# Patient Record
Sex: Male | Born: 1960 | Race: Black or African American | Hispanic: No | Marital: Single | Smoking: Current every day smoker
Health system: Southern US, Community
[De-identification: ages and names within clinical notes are randomized; demographics above are authoritative.]

## PROBLEM LIST (undated history)

## (undated) DIAGNOSIS — I10 Essential (primary) hypertension: Secondary | ICD-10-CM

## (undated) DIAGNOSIS — K219 Gastro-esophageal reflux disease without esophagitis: Secondary | ICD-10-CM

## (undated) DIAGNOSIS — M1990 Unspecified osteoarthritis, unspecified site: Secondary | ICD-10-CM

## (undated) HISTORY — PX: NECK SURGERY: SHX720

---

## 1998-09-11 ENCOUNTER — Ambulatory Visit (HOSPITAL_COMMUNITY): Admission: RE | Admit: 1998-09-11 | Discharge: 1998-09-11 | Payer: Self-pay | Admitting: Internal Medicine

## 1998-09-11 ENCOUNTER — Encounter: Payer: Self-pay | Admitting: Internal Medicine

## 1998-11-07 ENCOUNTER — Ambulatory Visit (HOSPITAL_COMMUNITY): Admission: RE | Admit: 1998-11-07 | Discharge: 1998-11-07 | Payer: Self-pay | Admitting: Orthopedic Surgery

## 1998-11-07 ENCOUNTER — Encounter: Payer: Self-pay | Admitting: Orthopedic Surgery

## 1999-05-27 ENCOUNTER — Encounter: Payer: Self-pay | Admitting: Neurosurgery

## 1999-05-27 ENCOUNTER — Ambulatory Visit (HOSPITAL_COMMUNITY): Admission: RE | Admit: 1999-05-27 | Discharge: 1999-05-27 | Payer: Self-pay | Admitting: Neurosurgery

## 1999-06-01 ENCOUNTER — Encounter: Payer: Self-pay | Admitting: Neurosurgery

## 1999-06-05 ENCOUNTER — Inpatient Hospital Stay (HOSPITAL_COMMUNITY): Admission: RE | Admit: 1999-06-05 | Discharge: 1999-06-07 | Payer: Self-pay | Admitting: Neurosurgery

## 1999-06-05 ENCOUNTER — Encounter: Payer: Self-pay | Admitting: Neurosurgery

## 1999-06-19 ENCOUNTER — Encounter: Admission: RE | Admit: 1999-06-19 | Discharge: 1999-06-19 | Payer: Self-pay | Admitting: Neurosurgery

## 1999-06-19 ENCOUNTER — Encounter: Payer: Self-pay | Admitting: Neurosurgery

## 1999-07-24 ENCOUNTER — Encounter: Admission: RE | Admit: 1999-07-24 | Discharge: 1999-07-24 | Payer: Self-pay | Admitting: Neurosurgery

## 1999-07-24 ENCOUNTER — Encounter: Payer: Self-pay | Admitting: Neurosurgery

## 1999-09-19 ENCOUNTER — Encounter: Payer: Self-pay | Admitting: Neurosurgery

## 1999-09-19 ENCOUNTER — Encounter: Admission: RE | Admit: 1999-09-19 | Discharge: 1999-09-19 | Payer: Self-pay | Admitting: Neurosurgery

## 2003-11-16 ENCOUNTER — Ambulatory Visit (HOSPITAL_COMMUNITY): Admission: RE | Admit: 2003-11-16 | Discharge: 2003-11-16 | Payer: Self-pay | Admitting: Internal Medicine

## 2003-11-16 IMAGING — CR DG LUMBAR SPINE COMPLETE 4+V
5 series · 5 of 5 positions shown · non-contrast
Comparison: none

CLINICAL DATA: Low back pain. 
 LUMBAR SPINE, COMPLETE
 Four view study with no priors.  
 There is degenerative disc disease at L4-5 and L5-S1 with prominent osteophytic spurring.  There is mild to moderate facet arthropathy.   No acute changes.  Soft tissues normal.  
 IMPRESSION 
 Degenerative disc disease at L4-5 and L5-S1.

[view not recorded (1 of 5)]
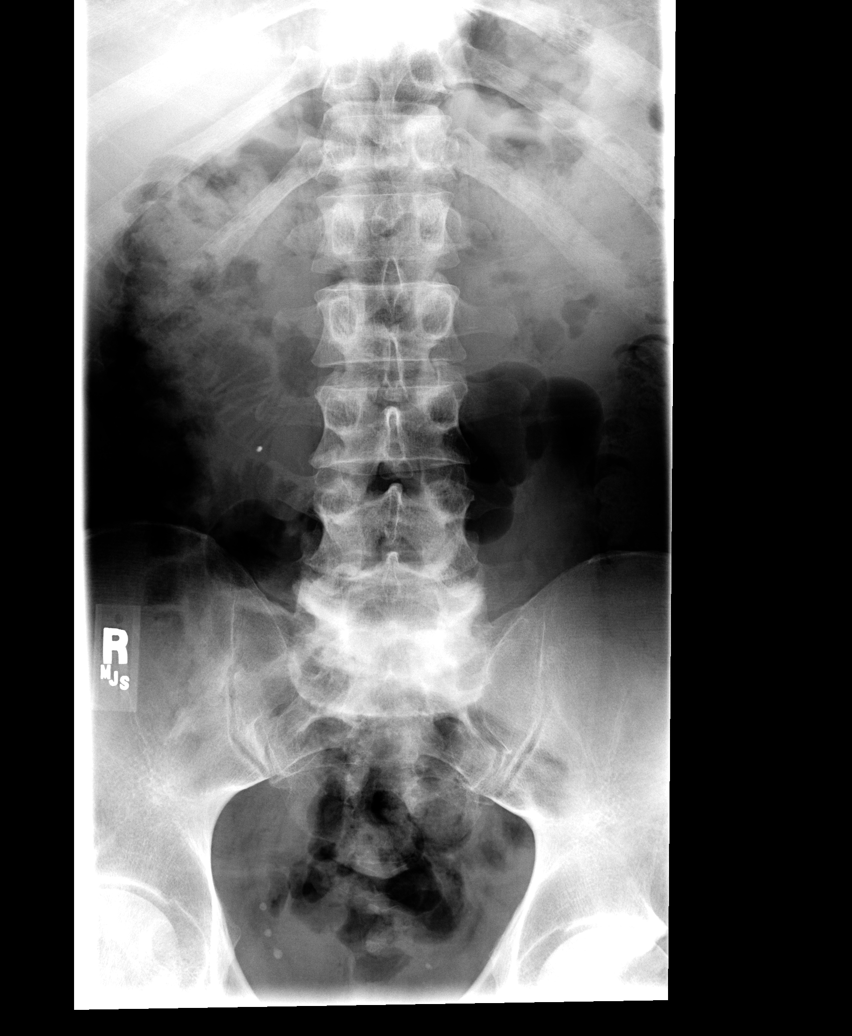

[view not recorded (2 of 5)]
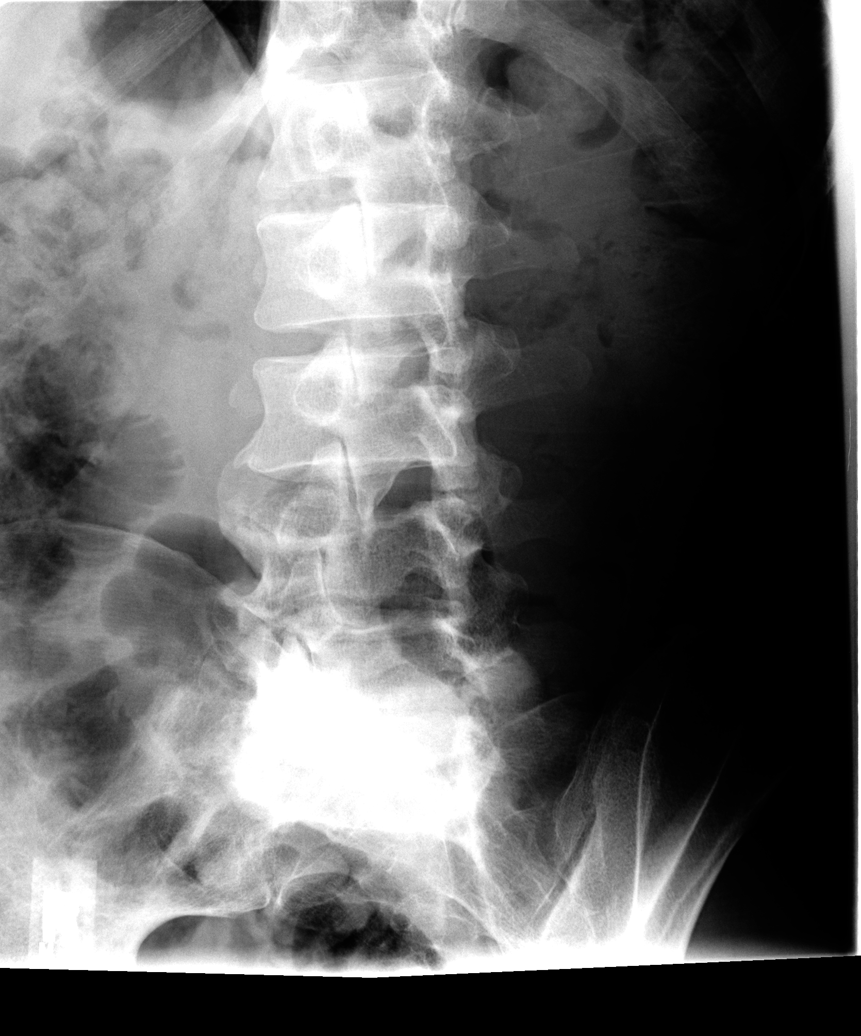

[view not recorded (3 of 5)]
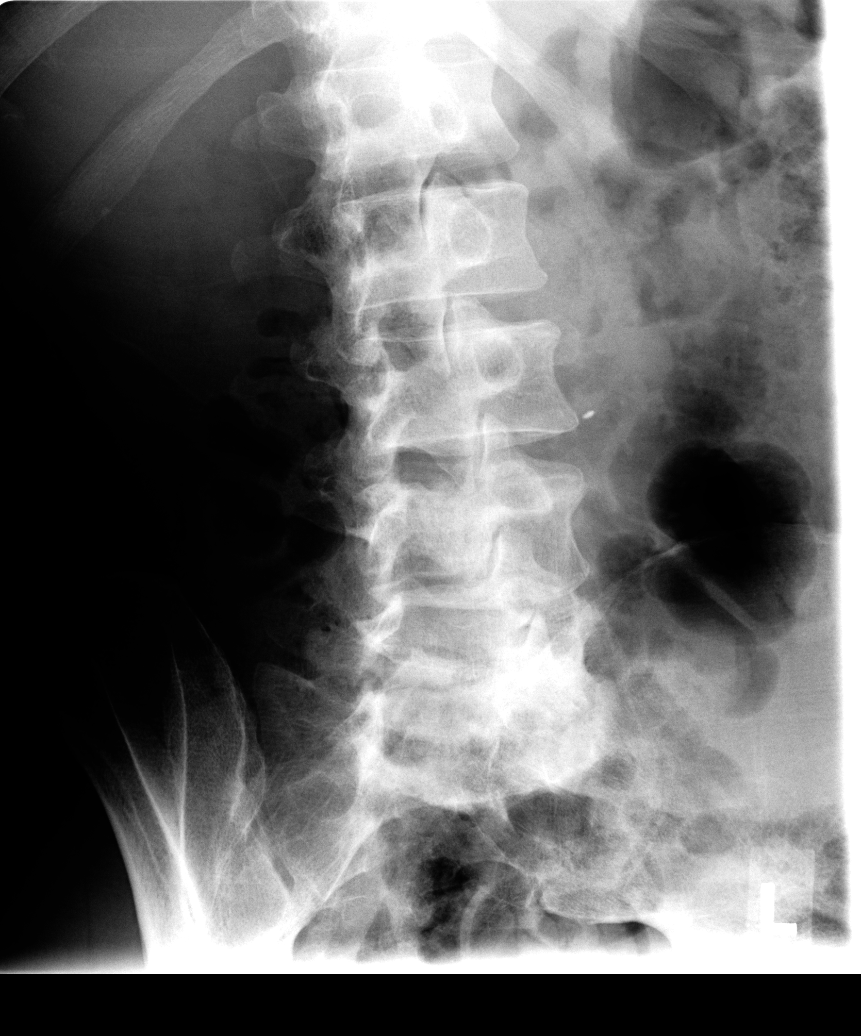

[view not recorded (4 of 5)]
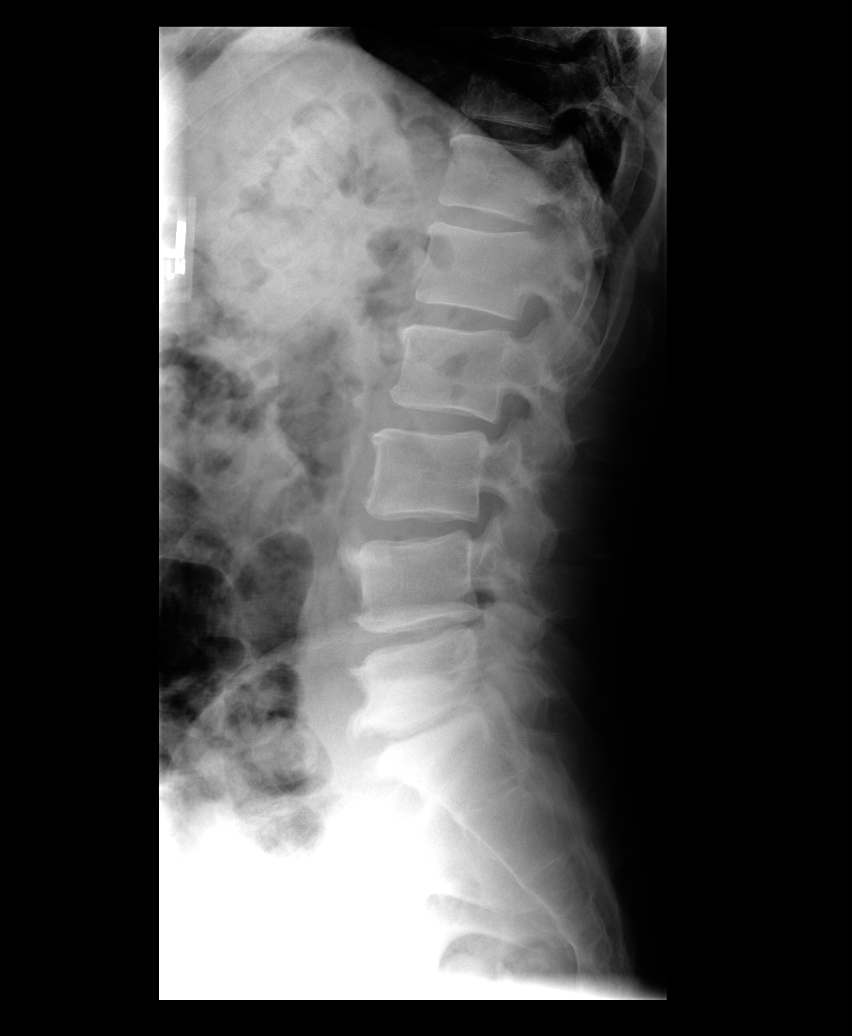

[view not recorded (5 of 5)]
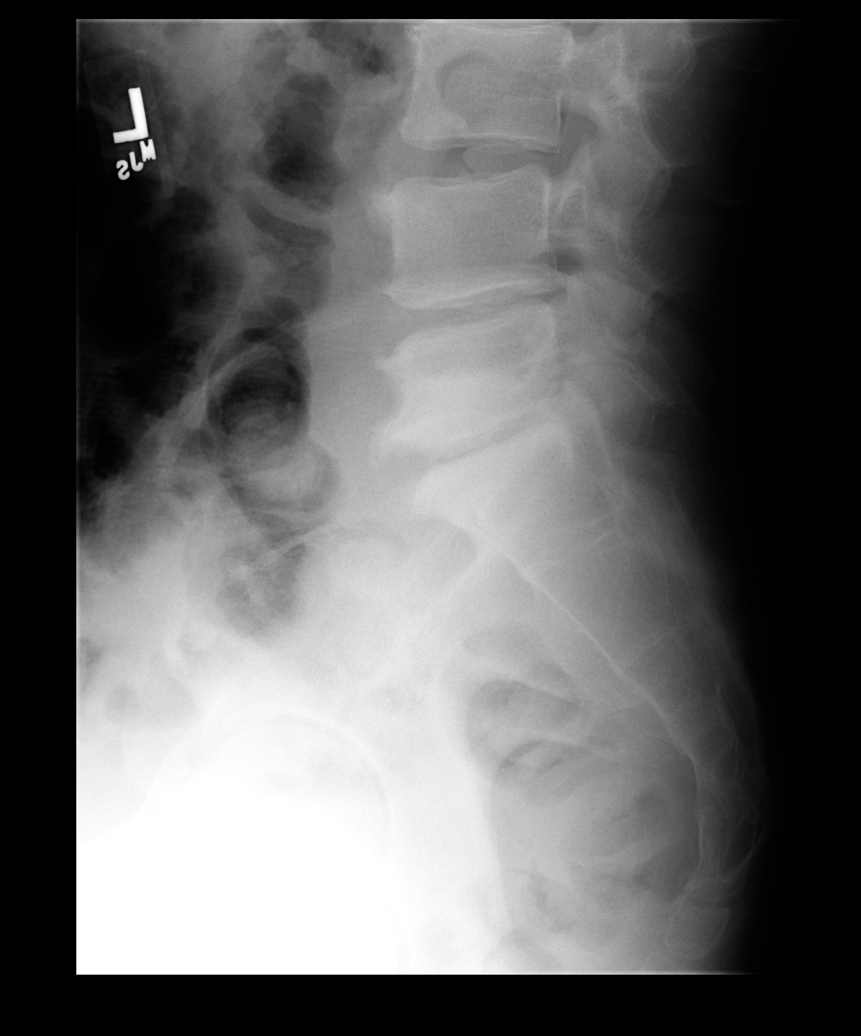

[5 of 5 positions shown; findings below may reference images not displayed]

## 2008-02-17 ENCOUNTER — Emergency Department (HOSPITAL_COMMUNITY): Admission: EM | Admit: 2008-02-17 | Discharge: 2008-02-17 | Payer: Self-pay | Admitting: Emergency Medicine

## 2008-02-17 IMAGING — CT CT PELVIS W/ CM
1 of 3 series · 14 of 32 positions shown, 19 images · IV contrast (APPLIED)
Comparison: None

CT ABDOMEN

CLINICAL DATA: Abdominal pain

CT ABDOMEN AND PELVIS WITH CONTRAST
TECHNIQUE: Multidetector CT imaging of the abdomen and pelvis was
performed using the standard protocol following bolus
administration of intravenous contrast.
Contrast: 100 ml omni 300

[Series 3: abd/pelv with 5.0 b31f st · axial · 0.68mm/px · z∈[-592,-202]mm · 14 of 88 slices shown, 19 images]
[im 5/88  soft-tissue]
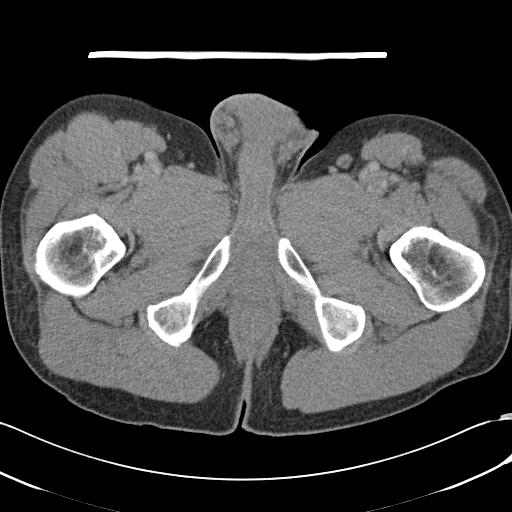
[im 5/88  bone]
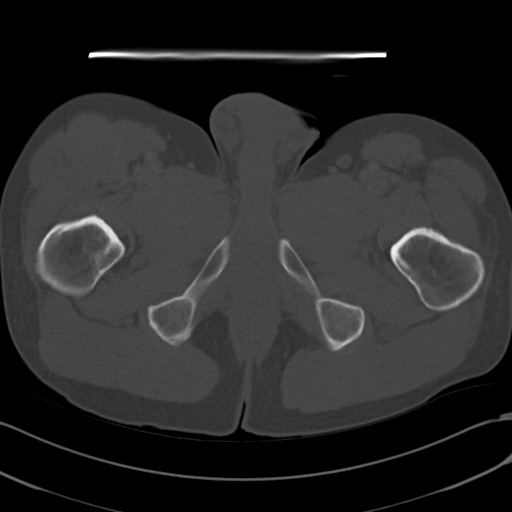
[im 14/88  soft-tissue]
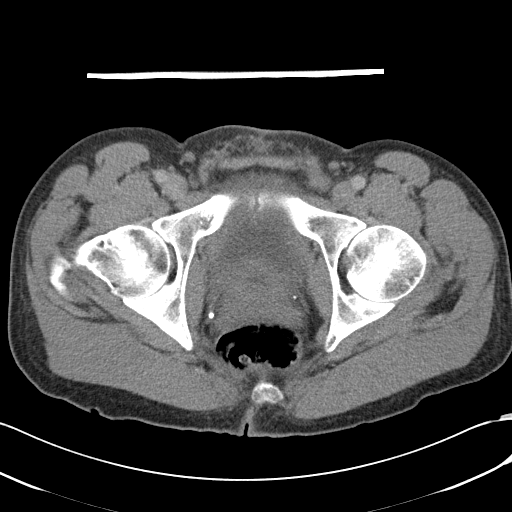
[im 18/88  soft-tissue]
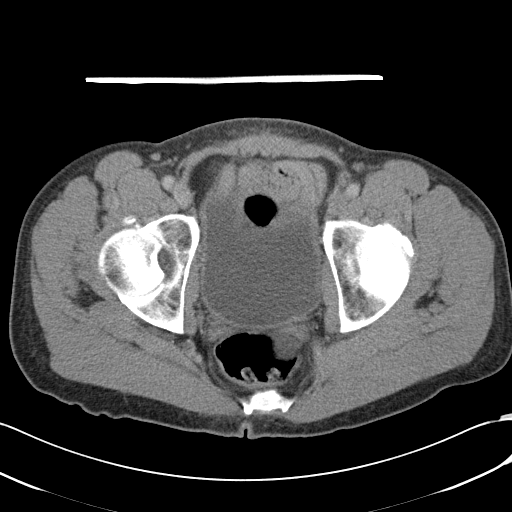
[im 27/88  soft-tissue]
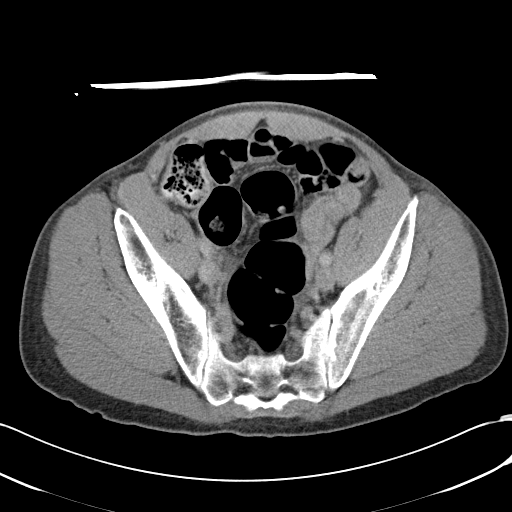
[im 31/88  soft-tissue]
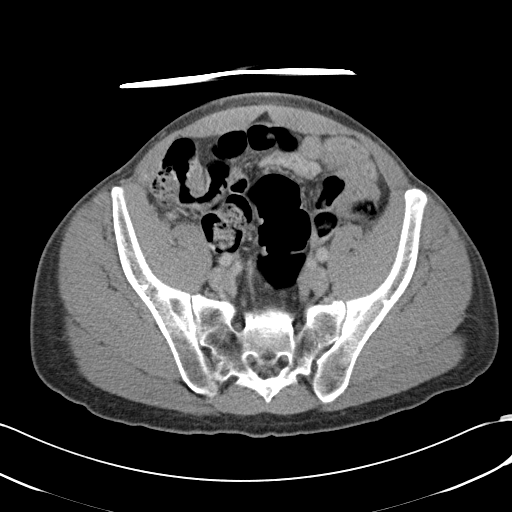
[im 40/88  soft-tissue]
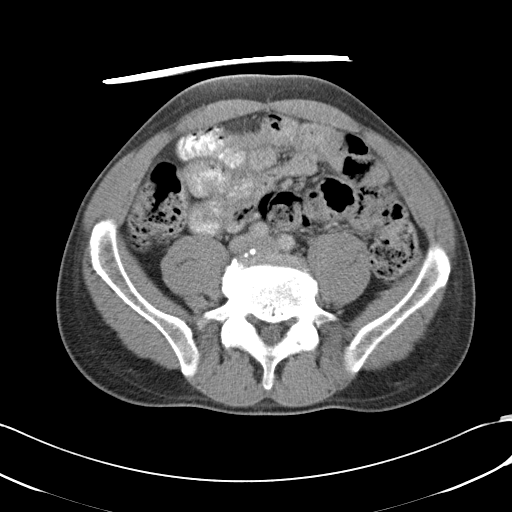
[im 44/88  soft-tissue]
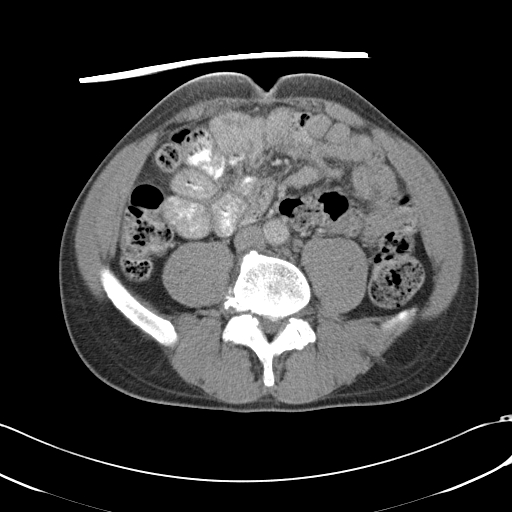
[im 48/88  soft-tissue]
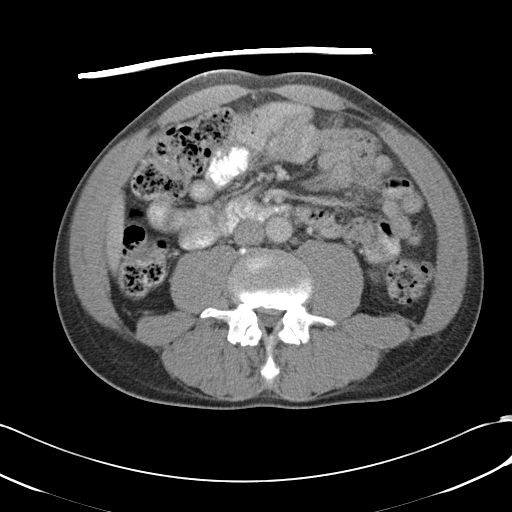
[im 57/88  soft-tissue]
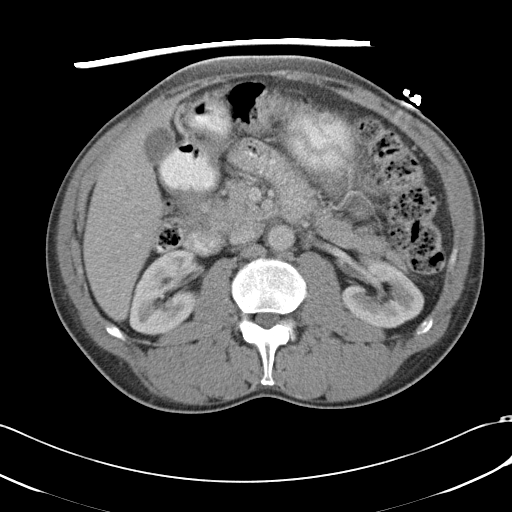
[im 57/88  bone]
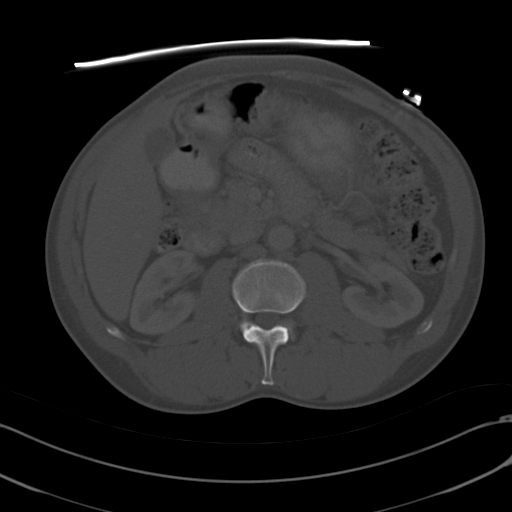
[im 61/88  soft-tissue]
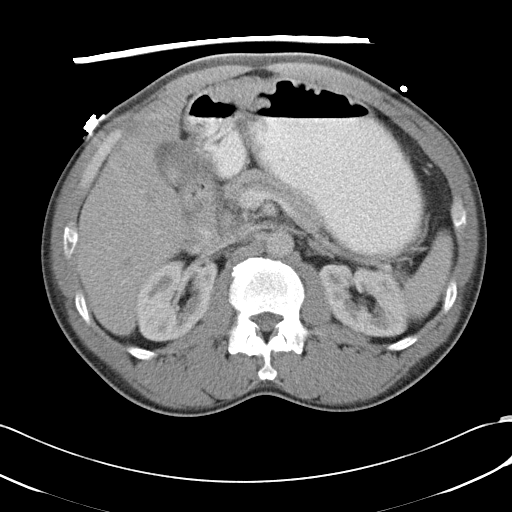
[im 70/88  soft-tissue]
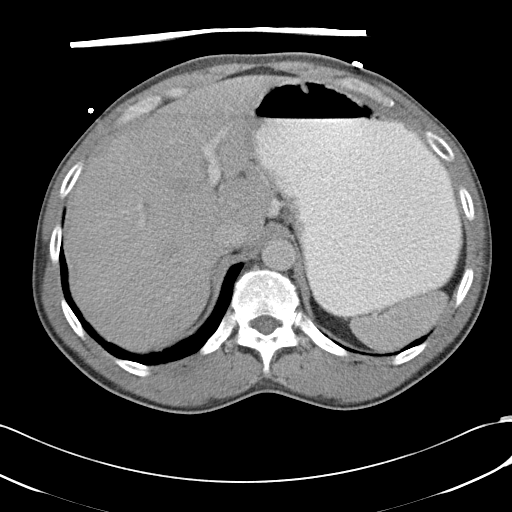
[im 70/88  lung]
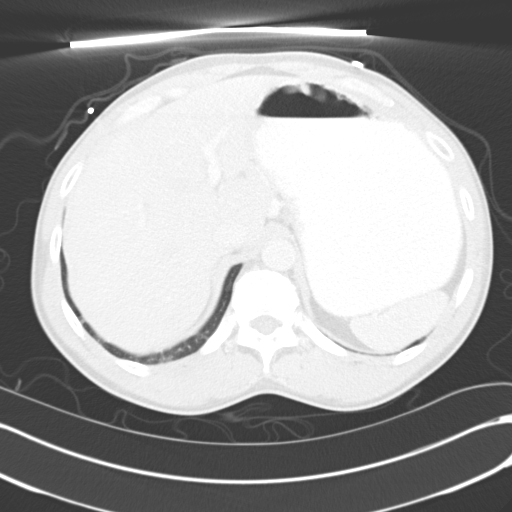
[im 74/88  soft-tissue]
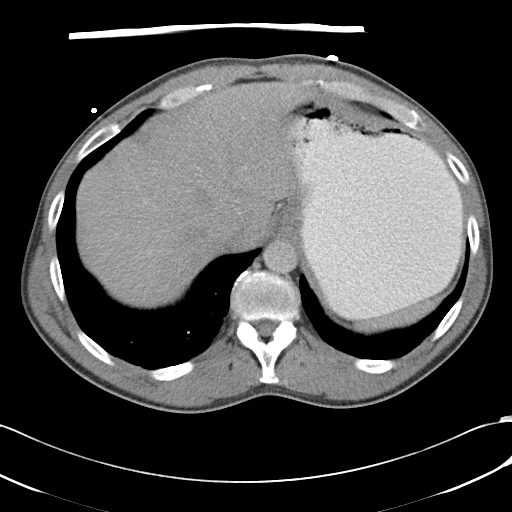
[im 74/88  lung]
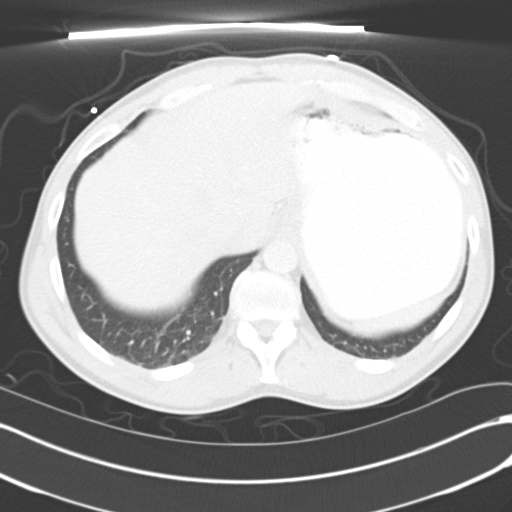
[im 79/88  lung]
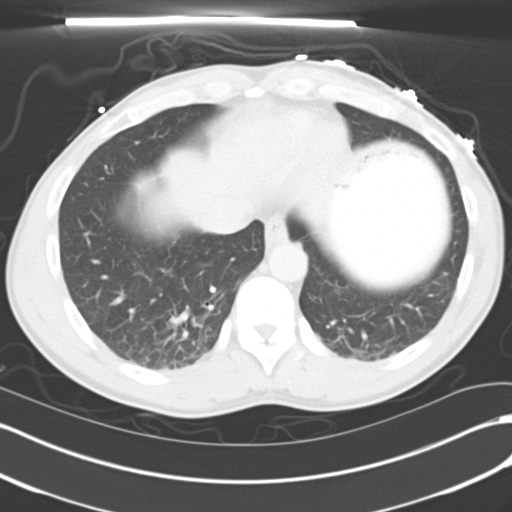
[im 83/88  soft-tissue]
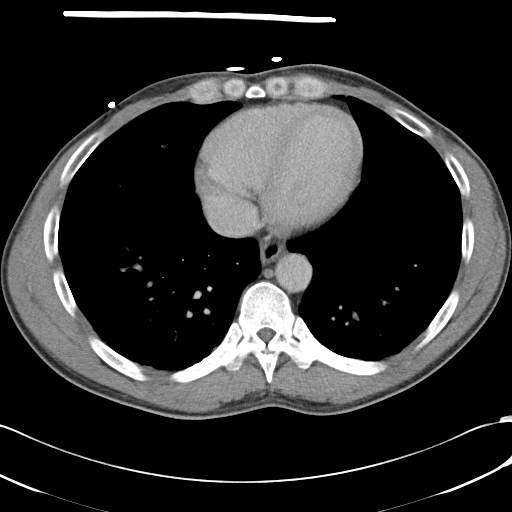
[im 83/88  lung]
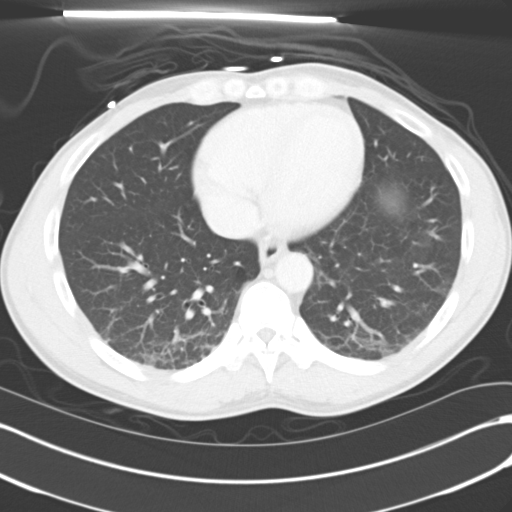

[14 of 32 positions shown; findings below may reference images not displayed]

FINDINGS: Coarsened interstitial markings are identified at the
lung bases which may reflect subsegmental atelectasis and/or
dependent change

Visualized lung bases are clear.

Liver is normal.

Spleen is normal.

The pancreas is normal.

The adrenal glands are normal.

 The kidneys are normal.

Gallbladder is unremarkable by CT.

No biliary ductal dilation.

Stomach and visualized large and small bowel are unremarkable.

Abdominal aorta normal is in caliber.

No significant lymphadenopathy.

No free fluid or abnormal fluid collections..

There is lumbar degenerative disc disease.
IMPRESSION: No acute upper abdominal CT findings.
2.  Lumbar degenerative disc disease.

CT PELVIS
FINDINGS: Appendix identified and normal.

Visualized colon and small bowel are unremarkable.

Trace free fluid is identified within the pelvis.

There is no abnormal fluid collections.

No significant lymphadenopathy.

Urinary bladder is normal.
IMPRESSION: 1.  Trace free fluid within the pelvis is nonspecific.
2.  Normal appendix

## 2008-05-02 ENCOUNTER — Emergency Department (HOSPITAL_COMMUNITY): Admission: EM | Admit: 2008-05-02 | Discharge: 2008-05-02 | Payer: Self-pay | Admitting: Emergency Medicine

## 2011-05-30 LAB — COMPREHENSIVE METABOLIC PANEL
ALT: 16
CO2: 25
Chloride: 107
Creatinine, Ser: 1.54 — ABNORMAL HIGH
GFR calc Af Amer: 59 — ABNORMAL LOW
Glucose, Bld: 86
Total Bilirubin: 1.3 — ABNORMAL HIGH

## 2011-05-30 LAB — DIFFERENTIAL
Eosinophils Absolute: 0.5
Lymphocytes Relative: 20
Monocytes Absolute: 0.4
Monocytes Relative: 5
Neutro Abs: 5.1

## 2011-05-30 LAB — URINALYSIS, ROUTINE W REFLEX MICROSCOPIC
Glucose, UA: NEGATIVE
Hgb urine dipstick: NEGATIVE
Ketones, ur: NEGATIVE
Nitrite: NEGATIVE
Protein, ur: NEGATIVE
Specific Gravity, Urine: 1.016

## 2011-05-30 LAB — LIPASE, BLOOD: Lipase: 18

## 2011-05-30 LAB — CBC
HCT: 39.9
Hemoglobin: 14.1
Platelets: 189
RDW: 13.4

## 2011-05-30 LAB — URINE MICROSCOPIC-ADD ON

## 2021-01-15 LAB — EXTERNAL GENERIC LAB PROCEDURE: COLOGUARD: NEGATIVE

## 2021-01-15 LAB — COLOGUARD: COLOGUARD: NEGATIVE

## 2021-02-16 ENCOUNTER — Other Ambulatory Visit: Payer: Self-pay | Admitting: Neurosurgery

## 2021-02-16 DIAGNOSIS — M5136 Other intervertebral disc degeneration, lumbar region: Secondary | ICD-10-CM

## 2021-02-16 DIAGNOSIS — M4712 Other spondylosis with myelopathy, cervical region: Secondary | ICD-10-CM

## 2021-03-06 ENCOUNTER — Other Ambulatory Visit: Payer: Self-pay

## 2021-03-08 ENCOUNTER — Other Ambulatory Visit: Payer: Self-pay

## 2021-03-19 ENCOUNTER — Ambulatory Visit
Admission: RE | Admit: 2021-03-19 | Discharge: 2021-03-19 | Disposition: A | Discharge status: Home / Self Care | Payer: Medicare HMO | Source: Ambulatory Visit | Attending: Neurosurgery | Admitting: Neurosurgery

## 2021-03-19 DIAGNOSIS — M4712 Other spondylosis with myelopathy, cervical region: Secondary | ICD-10-CM

## 2021-03-19 DIAGNOSIS — M5136 Other intervertebral disc degeneration, lumbar region: Secondary | ICD-10-CM

## 2021-03-19 DIAGNOSIS — M4722 Other spondylosis with radiculopathy, cervical region: Secondary | ICD-10-CM

## 2021-03-23 ENCOUNTER — Other Ambulatory Visit: Payer: Self-pay

## 2021-05-24 ENCOUNTER — Other Ambulatory Visit: Payer: Self-pay | Admitting: Neurosurgery

## 2021-05-24 DIAGNOSIS — M4712 Other spondylosis with myelopathy, cervical region: Secondary | ICD-10-CM

## 2021-05-24 DIAGNOSIS — M5136 Other intervertebral disc degeneration, lumbar region: Secondary | ICD-10-CM

## 2021-06-09 ENCOUNTER — Other Ambulatory Visit: Payer: Self-pay | Admitting: Neurosurgery

## 2021-06-09 DIAGNOSIS — M4712 Other spondylosis with myelopathy, cervical region: Secondary | ICD-10-CM

## 2021-06-22 ENCOUNTER — Ambulatory Visit
Admission: RE | Admit: 2021-06-22 | Discharge: 2021-06-22 | Disposition: A | Discharge status: Home / Self Care | Payer: Medicare HMO | Source: Ambulatory Visit | Attending: Neurosurgery | Admitting: Neurosurgery

## 2021-06-22 ENCOUNTER — Other Ambulatory Visit: Payer: Self-pay

## 2021-06-22 DIAGNOSIS — M4712 Other spondylosis with myelopathy, cervical region: Secondary | ICD-10-CM

## 2021-06-22 DIAGNOSIS — M5136 Other intervertebral disc degeneration, lumbar region: Secondary | ICD-10-CM

## 2021-06-22 IMAGING — MR MR CERVICAL SPINE W/O CM
4 of 5 series · 24 of 48 positions shown · non-contrast
Comparison: Prior MRIs could not be retrieved for comparison,
correlation is made with CT lumbar spine [DATE]

CLINICAL DATA: Left shoulder pain, bilateral leg pain and weakness

EXAM:
MRI CERVICAL, THORACIC AND LUMBAR SPINE WITHOUT CONTRAST
TECHNIQUE: Multiplanar and multiecho pulse sequences of the cervical spine, to
include the craniocervical junction and cervicothoracic junction,
and thoracic and lumbar spine, were obtained without intravenous
contrast.

[Series 2: T2 · sagittal · 3.0mm · 0.66mm/px · 6 of 19 slices shown (1 of 2)]
[im 1/19]
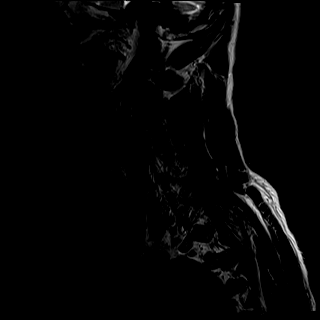
[im 4/19]
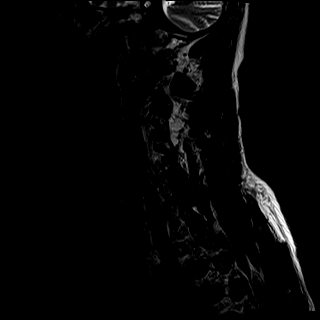
[im 8/19]
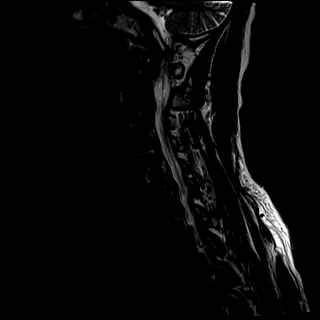
[im 11/19]
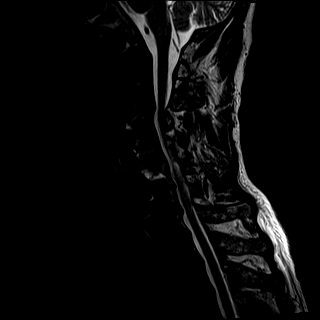
[im 15/19]
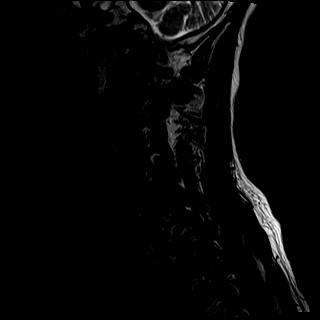
[im 19/19]
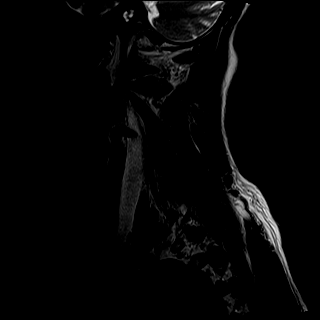

[Series 3: T1 · sagittal · 3.0mm · 0.41mm/px · 7 of 19 slices shown]
[im 1/19]
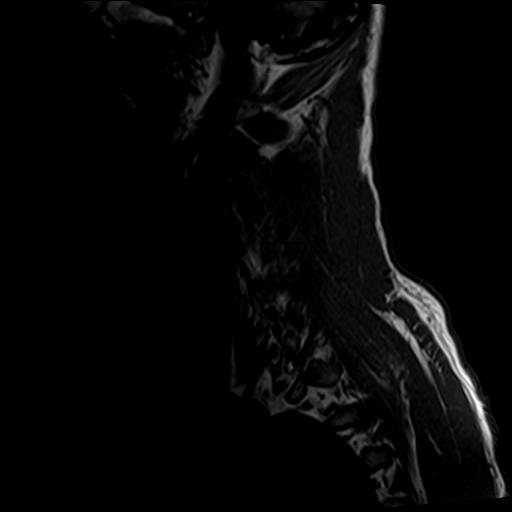
[im 4/19]
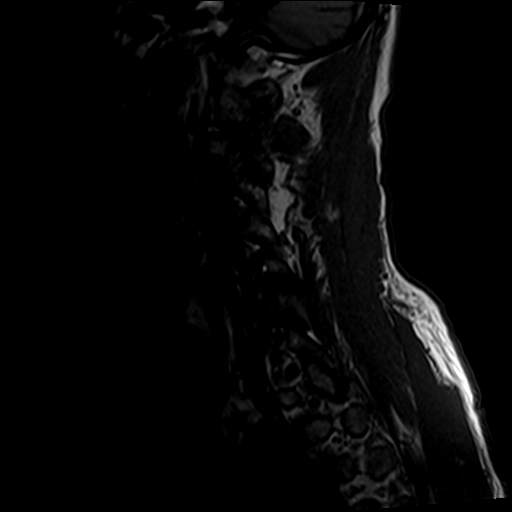
[im 7/19]
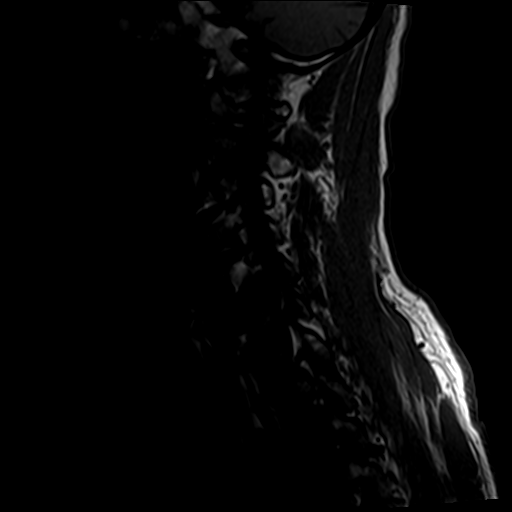
[im 10/19]
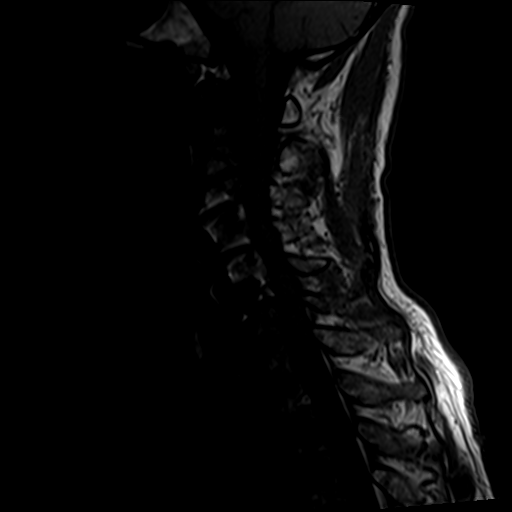
[im 13/19]
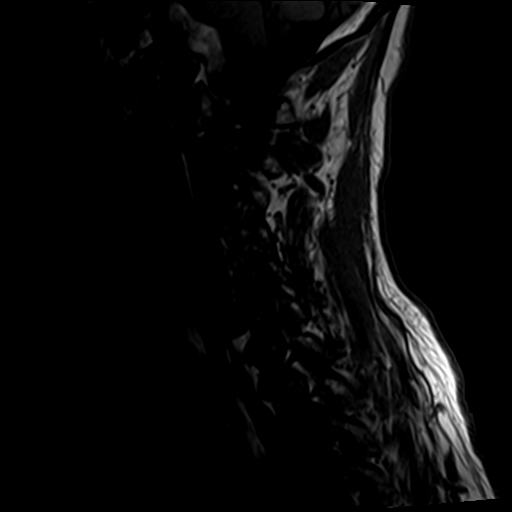
[im 16/19]
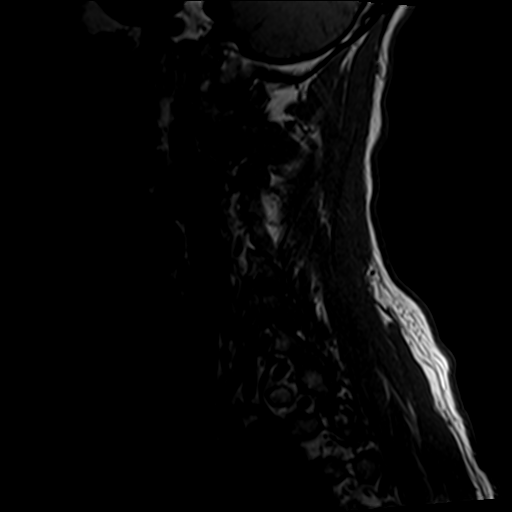
[im 19/19]
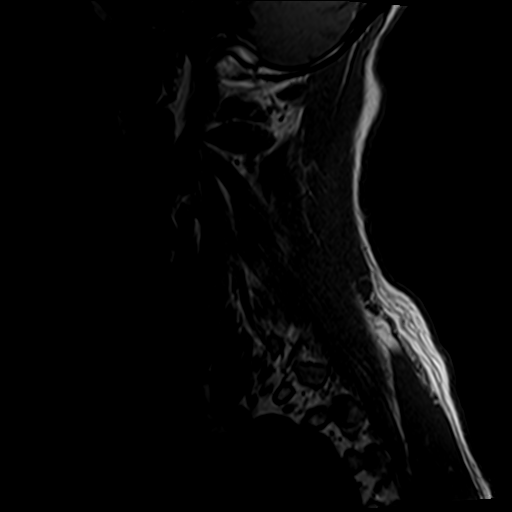

[Series 4: tir sag · sagittal · 3.0mm · 0.43mm/px · 3 of 19 slices shown]
[im 4/19]
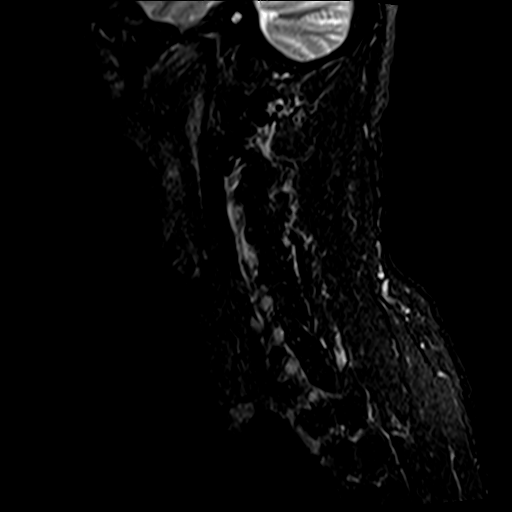
[im 10/19]
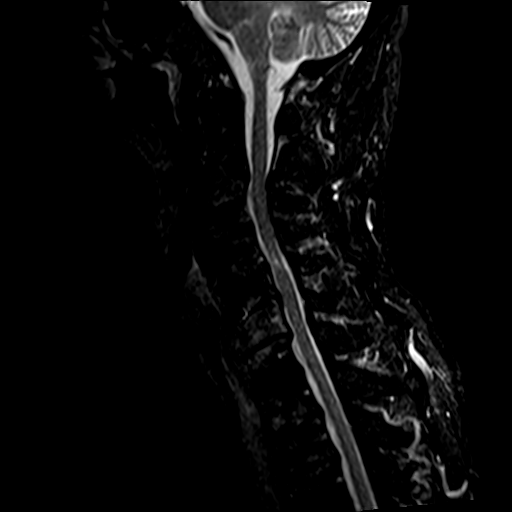
[im 16/19]
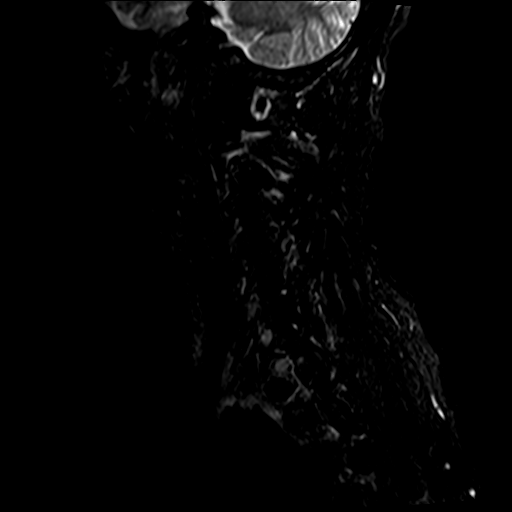

[Series 6: T2 · axial · 3.0mm · 0.78mm/px · z∈[-93,+48]mm · 8 of 40 slices shown (2 of 2)]
[im 1/40]
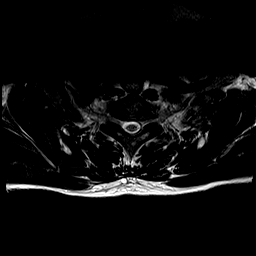
[im 7/40]
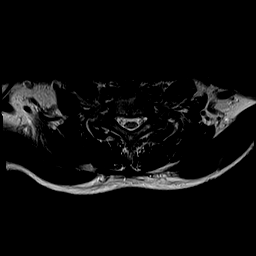
[im 13/40]
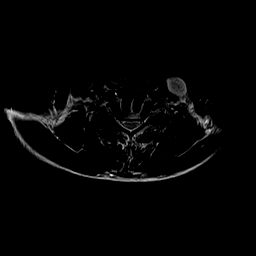
[im 19/40]
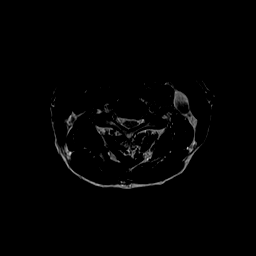
[im 22/40]
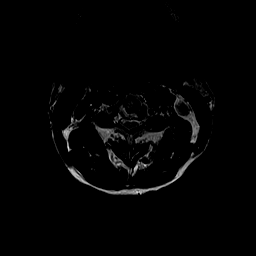
[im 28/40]
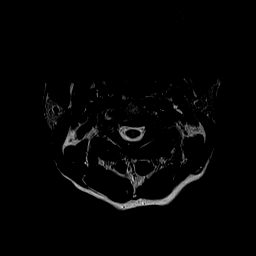
[im 34/40]
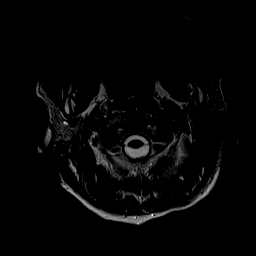
[im 40/40]
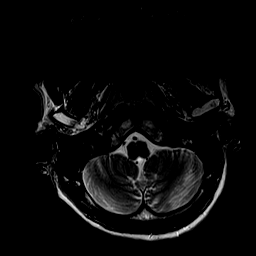

[24 of 48 positions shown; findings below may reference images not displayed]

FINDINGS: MRI CERVICAL SPINE FINDINGS

Alignment: Straightening of the normal cervical lordosis. No
listhesis.

Vertebrae: Status post ACDF C3-C5. Increased T2 signal at the C2-C3
endplates, the C5-C6 endplates, and the C6-C7 endplates, favored to
be degenerative. No acute fracture. Congenitally short pedicles,
which narrow the AP diameter of the spinal canal. Heterogeneous
marrow signal.

Cord: Focally increased T2 signal within the cervical spinal cord at
C4-C5 (series 2, image 9), with decreased cord caliber, likely
chronic myelomalacia. The spinal cord is otherwise normal in signal
and morphology.

Posterior Fossa, vertebral arteries, paraspinal tissues: Negative.

Disc levels:

C2-C3: Disc height loss with disc bulge, ligamentum flavum
thickening, facet and uncovertebral hypertrophy. Mild-to-moderate
spinal canal stenosis. Moderate right and mild left neural foraminal
narrowing.

C3-C4: Status post fusion. Mild-to-moderate spinal canal stenosis.
Uncovertebral and facet arthropathy. Mild bilateral neural foraminal
narrowing.

C4-C5: Status post fusion. Mild spinal canal stenosis. Uncovertebral
and facet arthropathy. Mild right and mild-to-moderate left neural
foraminal narrowing.

C5-C6: Disc height loss with disc osteophyte complex. Uncovertebral
and facet arthropathy. Mild spinal canal stenosis. Moderate to
severe bilateral neural foraminal narrowing.

C6-C7: Disc height loss with disc osteophyte complex. Mild spinal
canal stenosis. Facet arthropathy and uncovertebral hypertrophy.
Severe bilateral neural foraminal narrowing.

C7-T1: Minimal disc bulge. Facet arthropathy. Mild bilateral neural
foraminal narrowing.

MRI THORACIC SPINE FINDINGS

Alignment:  Physiologic.

Vertebrae: No acute fracture. Multiple T1 and T2 hyperintense
lesions, the largest of which is in T5, most likely hemangiomas.
Multilevel endplate degenerative changes, most prominent posteriorly
T2-T3 and anteriorly T11-T12. Heterogeneous marrow signal.

Cord: Focal mildly increased T2 signal at T11-T12. Otherwise normal
in caliber and signal.

Paraspinal and other soft tissues: Negative.

Disc levels:

T11-T12 disc height loss, disc bulge, facet arthropathy, and
ligamentum flavum hypertrophy, which leads to severe spinal canal
stenosis. Additional small disc bulges in the thoracic spine do not
cause significant spinal canal stenosis.

Multilevel facet arthropathy, which causes moderate neural foraminal
narrowing bilaterally at T1-T2, T2-T3, and T3-T4; moderate to severe
bilateral neural foraminal narrowing at T9-T10 and T10-T11; and
severe bilateral neural foraminal narrowing at T11-T12.

MRI LUMBAR SPINE FINDINGS

Segmentation:  Standard.

Alignment:  Straightening the normal lumbar lordosis.

Vertebrae: Multiple endplate degenerative changes, T2-S1 with mild
vertebral body loss. No focal fracture. No suspicious osseous
lesion. Heterogeneous marrow signal. Congenitally short pedicles,
which narrow the AP diameter of the spinal canal.

Conus medullaris and cauda equina: Conus extends to the L1 level.
Conus and cauda equina appear normal.

Paraspinal and other soft tissues: Negative.

Disc levels:

T12-L1: No significant disc bulge. Moderate to severe facet
arthropathy. Mild left neural foraminal narrowing. No spinal canal
stenosis

L1-L2: No significant disc bulge. Moderate facet arthropathy. No
spinal canal stenosis or neural foraminal narrowing.

L2-L3: Severe disc height loss with disc osteophyte complex and
moderate disc bulge. Moderate to severe facet arthropathy. Epidural
lipomatosis. Moderate to severe thecal sac narrowing. Moderate
bilateral neural foraminal narrowing.

L3-L4: Severe disc height loss with disc osteophyte complex and
moderate disc bulge with superimposed central protrusion. Moderate
to severe facet arthropathy. Epidural lipomatosis. Moderate to
severe thecal sac narrowing. Moderate to severe bilateral neural
foraminal narrowing.

L4-L5: Severe disc height loss with disc osteophyte complex.
Moderate to severe facet arthropathy. Epidural lipomatosis. Moderate
thecal sac narrowing. Severe left and moderate to severe right
neural foraminal narrowing.

L5-S1: Severe disc height loss and disc osteophyte complex. Moderate
facet arthropathy. No spinal canal stenosis. Severe bilateral neural
foraminal narrowing.
IMPRESSION: Cervical spine:

1. Focal increased T2 signal and decreased cord caliber at C4-C5,
likely chronic myelomalacia.
2. Status post ACDF C3-C5. Congenitally short pedicles lead to at
least mild spinal canal stenosis most cervical levels, including the
fused levels.
3. Multilevel facet and uncovertebral hypertrophy, worst at C6-C7,
where it is severe bilaterally, and C5-C6, where it is moderate to
severe bilaterally.

Thoracic spine:

1. T11-T12 severe spinal canal stenosis and severe bilateral neural
foraminal narrowing. Increased T2 signal within the spinal cord at
this level without apparent cord expansion, which may represent cord
edema.
2. Moderate to severe bilateral neural foraminal narrowing at T9-T10
and T10-T11.
3. Moderate bilateral neural foraminal narrowing at T1-T2, T2-T3,
and T3-T4.

Lumbar spine:

1. L2-L3 and L3-L4 moderate to severe thecal sac narrowing,
secondary to severe disc height loss, disc osteophyte complexes,
disc bulges, and epidural lipomatosis. L2-L3 moderate bilateral
neural foraminal narrowing and L3-L4 moderate to severe bilateral
neural foraminal narrowing.
2. L4-L5 moderate thecal sac narrowing, due to the aforementioned
factors, severe left and moderate to severe right neural foraminal
narrowing.
3. L5-S1 severe bilateral neural foraminal narrowing.

## 2021-06-22 IMAGING — MR MR LUMBAR SPINE W/O CM
4 of 5 series · 22 of 48 positions shown · non-contrast
Comparison: Prior MRIs could not be retrieved for comparison,
correlation is made with CT lumbar spine [DATE]

CLINICAL DATA: Left shoulder pain, bilateral leg pain and weakness

EXAM:
MRI CERVICAL, THORACIC AND LUMBAR SPINE WITHOUT CONTRAST
TECHNIQUE: Multiplanar and multiecho pulse sequences of the cervical spine, to
include the craniocervical junction and cervicothoracic junction,
and thoracic and lumbar spine, were obtained without intravenous
contrast.

[Series 3: T2 · sagittal · 4.0mm · 0.53mm/px · 6 of 19 slices shown (1 of 2)]
[im 1/19]
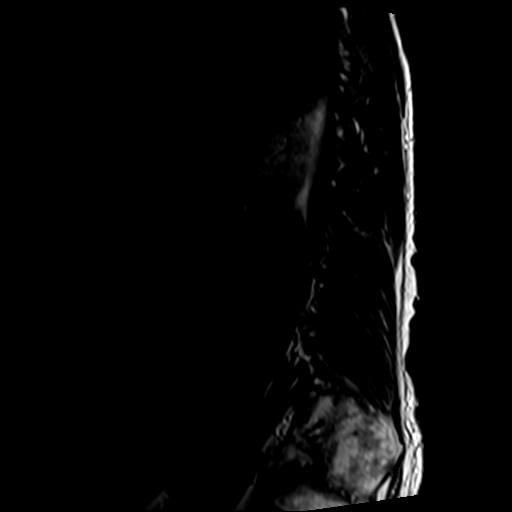
[im 4/19]
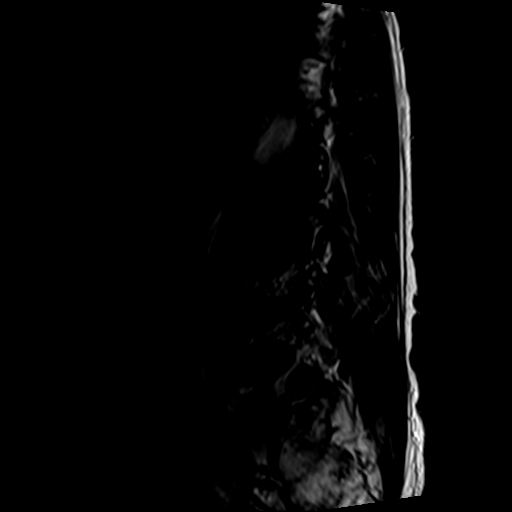
[im 8/19]
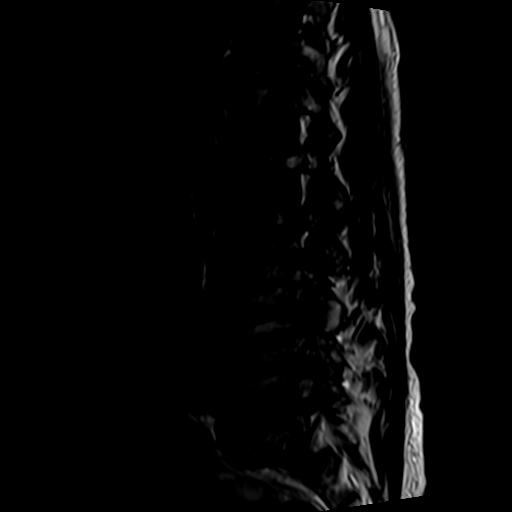
[im 11/19]
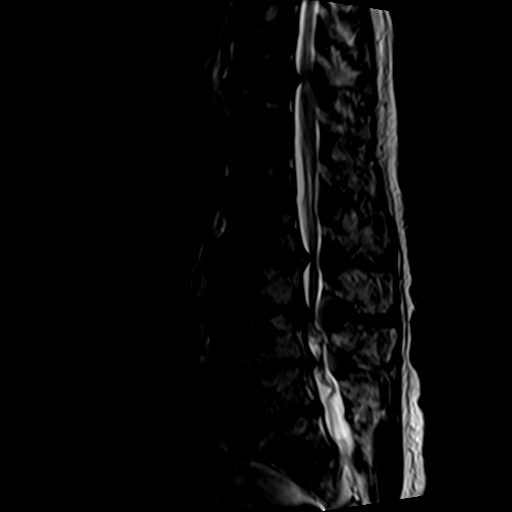
[im 15/19]
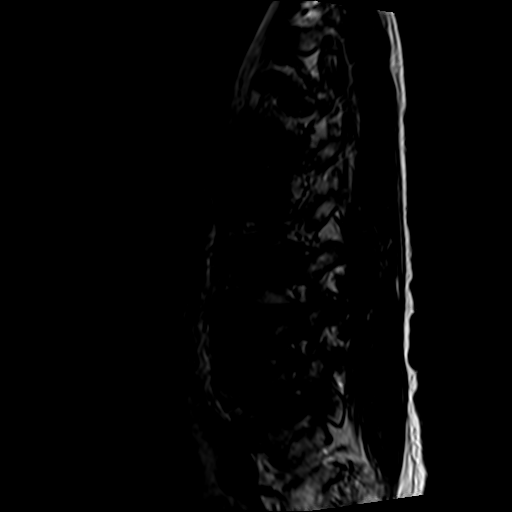
[im 19/19]
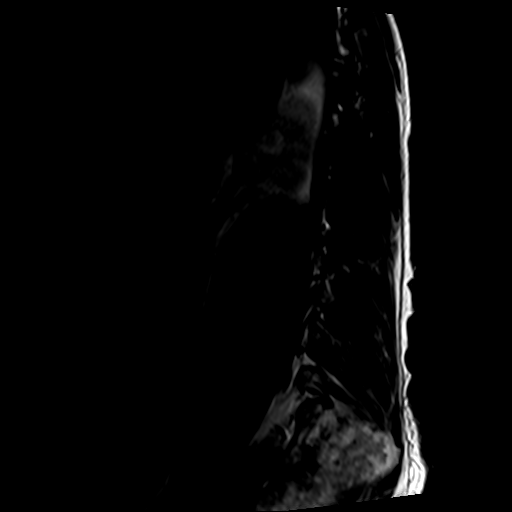

[Series 5: T1 · sagittal · 4.0mm · 0.53mm/px · 4 of 19 slices shown (1 of 2)]
[im 1/19]
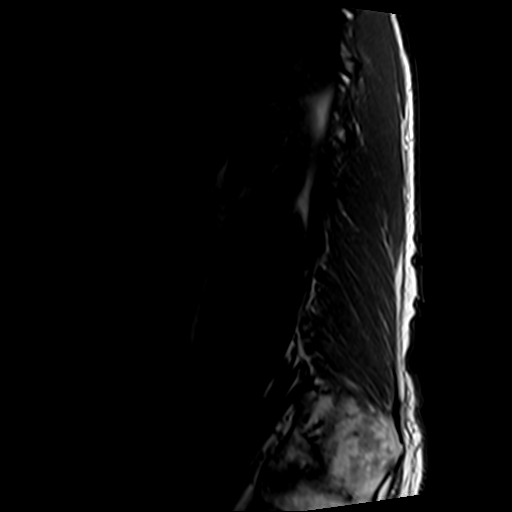
[im 4/19]
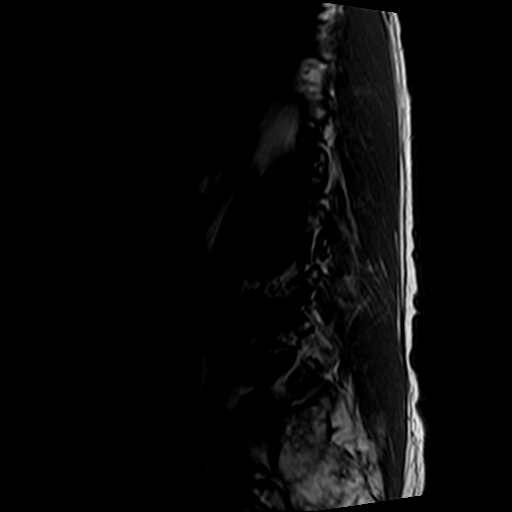
[im 11/19]
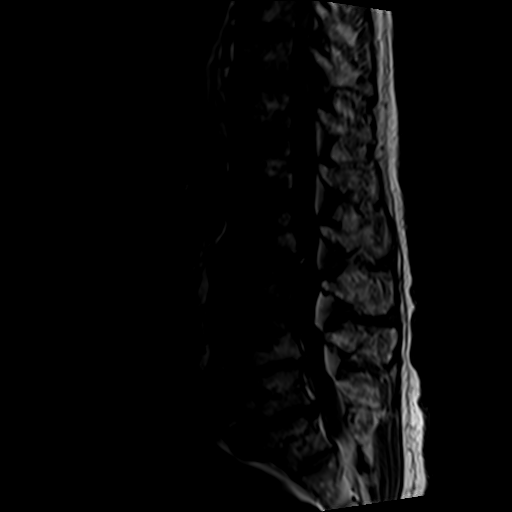
[im 19/19]
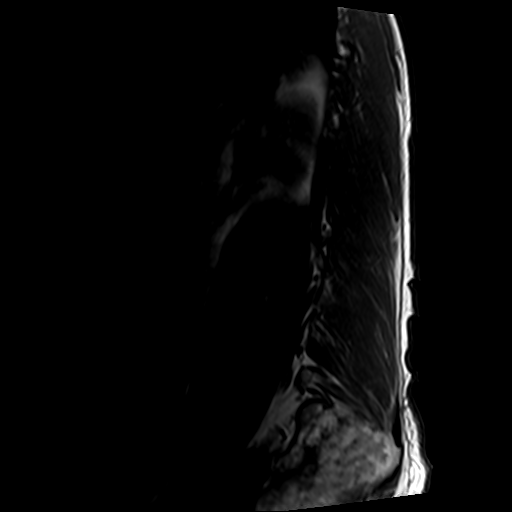

[Series 6: T2 · axial · 4.0mm · 0.78mm/px · z∈[-75,+185]mm · 9 of 50 slices shown (2 of 2)]
[im 1/50]
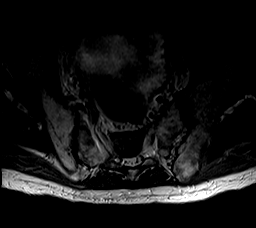
[im 8/50]
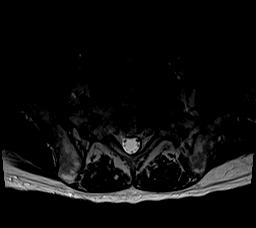
[im 15/50]
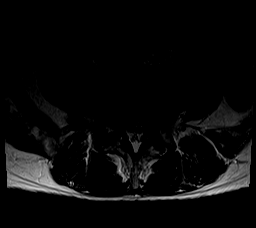
[im 22/50]
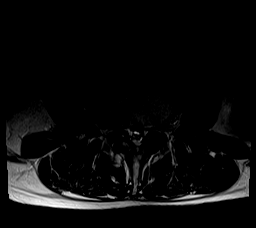
[im 25/50]
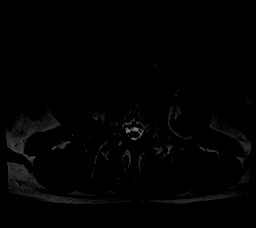
[im 29/50]
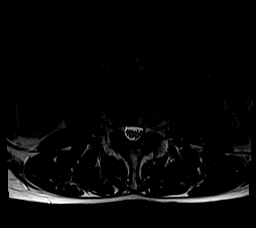
[im 36/50]
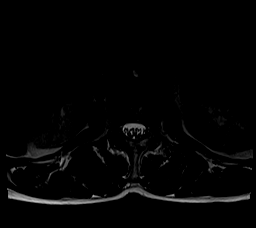
[im 43/50]
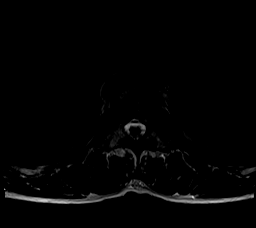
[im 50/50]
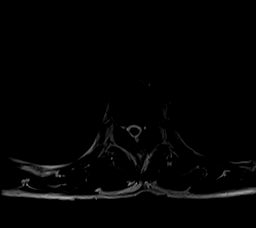

[Series 7: T1 · axial · 4.0mm · 0.39mm/px · z∈[-39,+149]mm · 3 of 50 slices shown (2 of 2)]
[im 8/50]
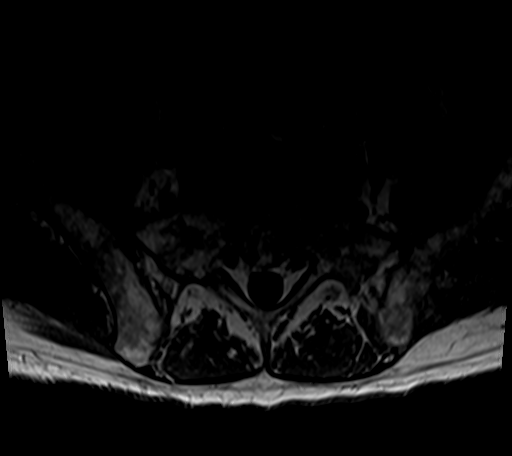
[im 25/50]
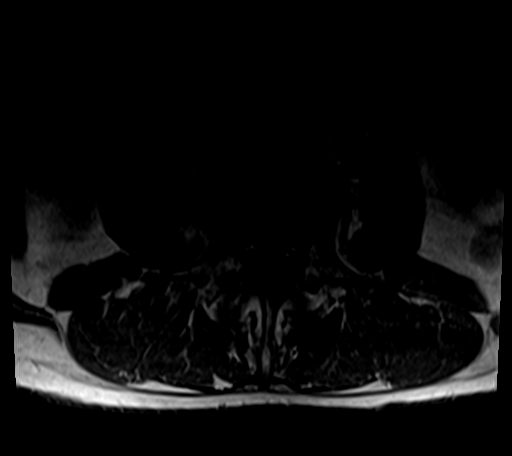
[im 43/50]
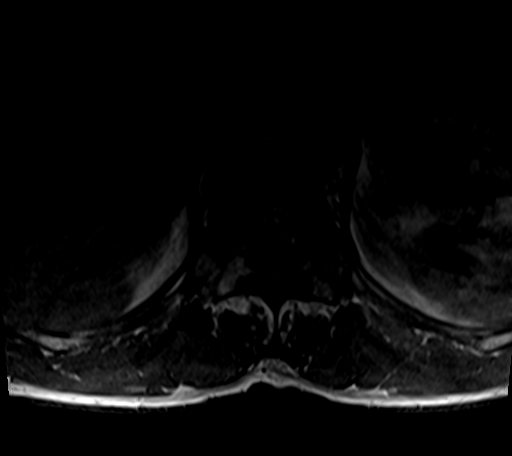

[22 of 48 positions shown; findings below may reference images not displayed]

FINDINGS: MRI CERVICAL SPINE FINDINGS

Alignment: Straightening of the normal cervical lordosis. No
listhesis.

Vertebrae: Status post ACDF C3-C5. Increased T2 signal at the C2-C3
endplates, the C5-C6 endplates, and the C6-C7 endplates, favored to
be degenerative. No acute fracture. Congenitally short pedicles,
which narrow the AP diameter of the spinal canal. Heterogeneous
marrow signal.

Cord: Focally increased T2 signal within the cervical spinal cord at
C4-C5 (series 2, image 9), with decreased cord caliber, likely
chronic myelomalacia. The spinal cord is otherwise normal in signal
and morphology.

Posterior Fossa, vertebral arteries, paraspinal tissues: Negative.

Disc levels:

C2-C3: Disc height loss with disc bulge, ligamentum flavum
thickening, facet and uncovertebral hypertrophy. Mild-to-moderate
spinal canal stenosis. Moderate right and mild left neural foraminal
narrowing.

C3-C4: Status post fusion. Mild-to-moderate spinal canal stenosis.
Uncovertebral and facet arthropathy. Mild bilateral neural foraminal
narrowing.

C4-C5: Status post fusion. Mild spinal canal stenosis. Uncovertebral
and facet arthropathy. Mild right and mild-to-moderate left neural
foraminal narrowing.

C5-C6: Disc height loss with disc osteophyte complex. Uncovertebral
and facet arthropathy. Mild spinal canal stenosis. Moderate to
severe bilateral neural foraminal narrowing.

C6-C7: Disc height loss with disc osteophyte complex. Mild spinal
canal stenosis. Facet arthropathy and uncovertebral hypertrophy.
Severe bilateral neural foraminal narrowing.

C7-T1: Minimal disc bulge. Facet arthropathy. Mild bilateral neural
foraminal narrowing.

MRI THORACIC SPINE FINDINGS

Alignment:  Physiologic.

Vertebrae: No acute fracture. Multiple T1 and T2 hyperintense
lesions, the largest of which is in T5, most likely hemangiomas.
Multilevel endplate degenerative changes, most prominent posteriorly
T2-T3 and anteriorly T11-T12. Heterogeneous marrow signal.

Cord: Focal mildly increased T2 signal at T11-T12. Otherwise normal
in caliber and signal.

Paraspinal and other soft tissues: Negative.

Disc levels:

T11-T12 disc height loss, disc bulge, facet arthropathy, and
ligamentum flavum hypertrophy, which leads to severe spinal canal
stenosis. Additional small disc bulges in the thoracic spine do not
cause significant spinal canal stenosis.

Multilevel facet arthropathy, which causes moderate neural foraminal
narrowing bilaterally at T1-T2, T2-T3, and T3-T4; moderate to severe
bilateral neural foraminal narrowing at T9-T10 and T10-T11; and
severe bilateral neural foraminal narrowing at T11-T12.

MRI LUMBAR SPINE FINDINGS

Segmentation:  Standard.

Alignment:  Straightening the normal lumbar lordosis.

Vertebrae: Multiple endplate degenerative changes, T2-S1 with mild
vertebral body loss. No focal fracture. No suspicious osseous
lesion. Heterogeneous marrow signal. Congenitally short pedicles,
which narrow the AP diameter of the spinal canal.

Conus medullaris and cauda equina: Conus extends to the L1 level.
Conus and cauda equina appear normal.

Paraspinal and other soft tissues: Negative.

Disc levels:

T12-L1: No significant disc bulge. Moderate to severe facet
arthropathy. Mild left neural foraminal narrowing. No spinal canal
stenosis

L1-L2: No significant disc bulge. Moderate facet arthropathy. No
spinal canal stenosis or neural foraminal narrowing.

L2-L3: Severe disc height loss with disc osteophyte complex and
moderate disc bulge. Moderate to severe facet arthropathy. Epidural
lipomatosis. Moderate to severe thecal sac narrowing. Moderate
bilateral neural foraminal narrowing.

L3-L4: Severe disc height loss with disc osteophyte complex and
moderate disc bulge with superimposed central protrusion. Moderate
to severe facet arthropathy. Epidural lipomatosis. Moderate to
severe thecal sac narrowing. Moderate to severe bilateral neural
foraminal narrowing.

L4-L5: Severe disc height loss with disc osteophyte complex.
Moderate to severe facet arthropathy. Epidural lipomatosis. Moderate
thecal sac narrowing. Severe left and moderate to severe right
neural foraminal narrowing.

L5-S1: Severe disc height loss and disc osteophyte complex. Moderate
facet arthropathy. No spinal canal stenosis. Severe bilateral neural
foraminal narrowing.
IMPRESSION: Cervical spine:

1. Focal increased T2 signal and decreased cord caliber at C4-C5,
likely chronic myelomalacia.
2. Status post ACDF C3-C5. Congenitally short pedicles lead to at
least mild spinal canal stenosis most cervical levels, including the
fused levels.
3. Multilevel facet and uncovertebral hypertrophy, worst at C6-C7,
where it is severe bilaterally, and C5-C6, where it is moderate to
severe bilaterally.

Thoracic spine:

1. T11-T12 severe spinal canal stenosis and severe bilateral neural
foraminal narrowing. Increased T2 signal within the spinal cord at
this level without apparent cord expansion, which may represent cord
edema.
2. Moderate to severe bilateral neural foraminal narrowing at T9-T10
and T10-T11.
3. Moderate bilateral neural foraminal narrowing at T1-T2, T2-T3,
and T3-T4.

Lumbar spine:

1. L2-L3 and L3-L4 moderate to severe thecal sac narrowing,
secondary to severe disc height loss, disc osteophyte complexes,
disc bulges, and epidural lipomatosis. L2-L3 moderate bilateral
neural foraminal narrowing and L3-L4 moderate to severe bilateral
neural foraminal narrowing.
2. L4-L5 moderate thecal sac narrowing, due to the aforementioned
factors, severe left and moderate to severe right neural foraminal
narrowing.
3. L5-S1 severe bilateral neural foraminal narrowing.

## 2021-07-09 ENCOUNTER — Other Ambulatory Visit: Payer: Self-pay | Admitting: Neurosurgery

## 2021-07-25 NOTE — Pre-Procedure Instructions (Signed)
Surgical Instructions    Your procedure is scheduled on Friday, August 03, 2021 at 8:30 AM.  Report to [facility] Main Entrance "A" at 6:30 A.M., then check in with the Admitting office.  Call this number if you have problems the morning of surgery:  [phone]   If you have any questions prior to your surgery date call [phone]: Open Monday-Friday 8am-4pm    Remember:  Do not eat after midnight the night before your surgery  You may drink clear liquids until 5:30 AM the morning of your surgery.   Clear liquids allowed are: Water, Non-Citrus Juices (without pulp), Carbonated Beverages, Clear Tea, Black Coffee Only, and Gatorade    Take these medicines the morning of surgery with A SIP OF WATER:  amLODipine (NORVASC) atorvastatin (LIPITOR)   As of today, STOP taking any Aspirin (unless otherwise instructed by your surgeon) Aleve, Naproxen, Ibuprofen, Motrin, Advil, Goody's, BC's, all herbal medications, fish oil, and all vitamins.                     Do NOT Smoke (Tobacco/Vaping) or drink Alcohol 24 hours prior to your procedure.  If you use a CPAP at night, you may bring all equipment for your overnight stay.   Contacts, glasses, piercing's, hearing aid's, dentures or partials may not be worn into surgery, please bring cases for these belongings.    For patients admitted to the hospital, discharge time will be determined by your treatment team.   Patients discharged the day of surgery will not be allowed to drive home, and someone needs to stay with them for 24 hours.  NO VISITORS WILL BE ALLOWED IN PRE-OP WHERE PATIENTS GET READY FOR SURGERY.  ONLY 1 SUPPORT PERSON MAY BE PRESENT IN THE WAITING ROOM WHILE YOU ARE IN SURGERY.  IF YOU ARE TO BE ADMITTED, ONCE YOU ARE IN YOUR ROOM YOU WILL BE ALLOWED TWO (2) VISITORS.  Minor children may have two parents present. Special consideration for safety and communication needs will be reviewed on a case by case basis.   Special  instructions:   [place]- Preparing For Surgery  Before surgery, you can play an important role. Because skin is not sterile, your skin needs to be as free of germs as possible. You can reduce the number of germs on your skin by washing with CHG (chlorahexidine gluconate) Soap before surgery.  CHG is an antiseptic cleaner which kills germs and bonds with the skin to continue killing germs even after washing.    Oral Hygiene is also important to reduce your risk of infection.  Remember - BRUSH YOUR TEETH THE MORNING OF SURGERY WITH YOUR REGULAR TOOTHPASTE  Please do not use if you have an allergy to CHG or antibacterial soaps. If your skin becomes reddened/irritated stop using the CHG.  Do not shave (including legs and underarms) for at least 48 hours prior to first CHG shower. It is OK to shave your face.  Please follow these instructions carefully.   Shower the NIGHT BEFORE SURGERY and the MORNING OF SURGERY  If you chose to wash your hair, wash your hair first as usual with your normal shampoo.  After you shampoo, rinse your hair and body thoroughly to remove the shampoo.  Use CHG Soap as you would any other liquid soap. You can apply CHG directly to the skin and wash gently with a scrungie or a clean washcloth.   Apply the CHG Soap to your body ONLY FROM THE  NECK DOWN.  Do not use on open wounds or open sores. Avoid contact with your eyes, ears, mouth and genitals (private parts). Wash Face and genitals (private parts)  with your normal soap.   Wash thoroughly, paying special attention to the area where your surgery will be performed.  Thoroughly rinse your body with warm water from the neck down.  DO NOT shower/wash with your normal soap after using and rinsing off the CHG Soap.  Pat yourself dry with a CLEAN TOWEL.  Wear CLEAN PAJAMAS to bed the night before surgery  Place CLEAN SHEETS on your bed the night before your surgery  DO NOT SLEEP WITH PETS.   Day of  Surgery: Shower with CHG soap. Do not wear jewelry. Do not wear lotions, powders, colognes, or deodorant. Men may shave face and neck. Do not bring valuables to the hospital. [facility] is not responsible for any belongings or valuables. Wear Clean/Comfortable clothing the morning of surgery Remember to brush your teeth WITH YOUR REGULAR TOOTHPASTE.   Please read over the following fact sheets that you were given.   3 days prior to your procedure or After your COVID test   You are not required to quarantine however you are required to wear a well-fitting mask when you are out and around people not in your household. If your mask becomes wet or soiled, replace with a new one.   Wash your hands often with soap and water for 20 seconds or clean your hands with an alcohol-based hand sanitizer that contains at least 60% alcohol.   Do not share personal items.   Notify your provider:  o if you are in close contact with someone who has COVID  o or if you develop a fever of 100.4 or greater, sneezing, cough, sore throat, shortness of breath or body aches.

## 2021-07-30 ENCOUNTER — Encounter (HOSPITAL_COMMUNITY): Payer: Self-pay

## 2021-07-30 ENCOUNTER — Other Ambulatory Visit: Payer: Self-pay

## 2021-07-30 ENCOUNTER — Encounter (HOSPITAL_COMMUNITY)
Admission: RE | Admit: 2021-07-30 | Discharge: 2021-07-30 | Disposition: A | Discharge status: Home / Self Care | Payer: Medicare HMO | Source: Ambulatory Visit | Attending: Neurosurgery | Admitting: Neurosurgery

## 2021-07-30 DIAGNOSIS — Z20822 Contact with and (suspected) exposure to covid-19: Secondary | ICD-10-CM | POA: Insufficient documentation

## 2021-07-30 DIAGNOSIS — Z01818 Encounter for other preprocedural examination: Secondary | ICD-10-CM | POA: Insufficient documentation

## 2021-07-30 HISTORY — DX: Gastro-esophageal reflux disease without esophagitis: K21.9

## 2021-07-30 HISTORY — DX: Unspecified osteoarthritis, unspecified site: M19.90

## 2021-07-30 HISTORY — DX: Essential (primary) hypertension: I10

## 2021-07-30 LAB — COMPREHENSIVE METABOLIC PANEL
ALT: 23 U/L (ref 0–44)
AST: 22 U/L (ref 15–41)
Albumin: 3.8 g/dL (ref 3.5–5.0)
Alkaline Phosphatase: 70 U/L (ref 38–126)
Anion gap: 7 (ref 5–15)
BUN: 14 mg/dL (ref 6–20)
CO2: 23 mmol/L (ref 22–32)
Calcium: 9.9 mg/dL (ref 8.9–10.3)
Chloride: 105 mmol/L (ref 98–111)
Creatinine, Ser: 1.41 mg/dL — ABNORMAL HIGH (ref 0.61–1.24)
GFR, Estimated: 57 mL/min — ABNORMAL LOW (ref >60)
Glucose, Bld: 78 mg/dL (ref 70–99)
Potassium: 3.8 mmol/L (ref 3.5–5.1)
Sodium: 135 mmol/L (ref 135–145)
Total Bilirubin: 1.1 mg/dL (ref 0.3–1.2)
Total Protein: 7.4 g/dL (ref 6.5–8.1)

## 2021-07-30 LAB — SURGICAL PCR SCREEN
MRSA, PCR: NEGATIVE
Staphylococcus aureus: NEGATIVE

## 2021-07-30 LAB — CBC
HCT: 36.6 % — ABNORMAL LOW (ref 39.0–52.0)
Hemoglobin: 13.7 g/dL (ref 13.0–17.0)
MCH: 34 pg (ref 26.0–34.0)
MCHC: 37.4 g/dL — ABNORMAL HIGH (ref 30.0–36.0)
MCV: 90.8 fL (ref 80.0–100.0)
Platelets: 227 10*3/uL (ref 150–400)
RBC: 4.03 MIL/uL — ABNORMAL LOW (ref 4.22–5.81)
RDW: 13.4 % (ref 11.5–15.5)
WBC: 7.3 10*3/uL (ref 4.0–10.5)
nRBC: 0 % (ref 0.0–0.2)

## 2021-07-30 LAB — SARS CORONAVIRUS 2 (TAT 6-24 HRS): SARS Coronavirus 2: NEGATIVE

## 2021-07-30 NOTE — Progress Notes (Signed)
PCP: Dr. Abran Duke   Cardiologist:Denies  EKG: Today CXR: n/a ECHO: denies Stress Test: denies Cardiac Cath: denies  Covid tested today, aware to wear a mask  ERAS: Clears until 5:30a  Patient denies shortness of breath, fever, cough, and chest pain at PAT appointment.  Patient verbalized understanding of instructions provided today at the PAT appointment.  Patient asked to review instructions at home and day of surgery.

## 2021-08-03 ENCOUNTER — Ambulatory Visit (HOSPITAL_COMMUNITY): Payer: Medicare HMO

## 2021-08-03 ENCOUNTER — Ambulatory Visit (HOSPITAL_COMMUNITY): Payer: Medicare HMO | Admitting: Anesthesiology

## 2021-08-03 ENCOUNTER — Encounter (HOSPITAL_COMMUNITY): Payer: Self-pay | Admitting: Neurosurgery

## 2021-08-03 ENCOUNTER — Other Ambulatory Visit: Payer: Self-pay

## 2021-08-03 ENCOUNTER — Observation Stay (HOSPITAL_COMMUNITY)
Admission: RE | Admit: 2021-08-03 | Discharge: 2021-08-04 | Disposition: A | Discharge status: Home / Self Care | Payer: Medicare HMO | Attending: Neurosurgery | Admitting: Neurosurgery

## 2021-08-03 ENCOUNTER — Encounter (HOSPITAL_COMMUNITY)
Admission: RE | Disposition: A | Discharge status: Home / Self Care | Payer: Self-pay | Source: Home / Self Care | Attending: Neurosurgery

## 2021-08-03 DIAGNOSIS — Z419 Encounter for procedure for purposes other than remedying health state, unspecified: Secondary | ICD-10-CM

## 2021-08-03 DIAGNOSIS — E119 Type 2 diabetes mellitus without complications: Secondary | ICD-10-CM | POA: Insufficient documentation

## 2021-08-03 DIAGNOSIS — M4804 Spinal stenosis, thoracic region: Secondary | ICD-10-CM | POA: Diagnosis present

## 2021-08-03 DIAGNOSIS — I10 Essential (primary) hypertension: Secondary | ICD-10-CM | POA: Insufficient documentation

## 2021-08-03 DIAGNOSIS — F17210 Nicotine dependence, cigarettes, uncomplicated: Secondary | ICD-10-CM | POA: Diagnosis not present

## 2021-08-03 DIAGNOSIS — R2689 Other abnormalities of gait and mobility: Secondary | ICD-10-CM | POA: Insufficient documentation

## 2021-08-03 HISTORY — PX: LUMBAR LAMINECTOMY/DECOMPRESSION MICRODISCECTOMY: SHX5026

## 2021-08-03 IMAGING — RF DG THORACOLUMBAR SPINE 2V
1 series · 2 of 2 positions shown · non-contrast
Comparison: MR thoracic spine [DATE]

CLINICAL DATA: Intraoperative fluoroscopy for localization.

EXAM:
OPERATIVE THORACIC SPINE 1 VIEW(S)

[Series 1: run · 2 of 2 slices shown]
[im 1/2]
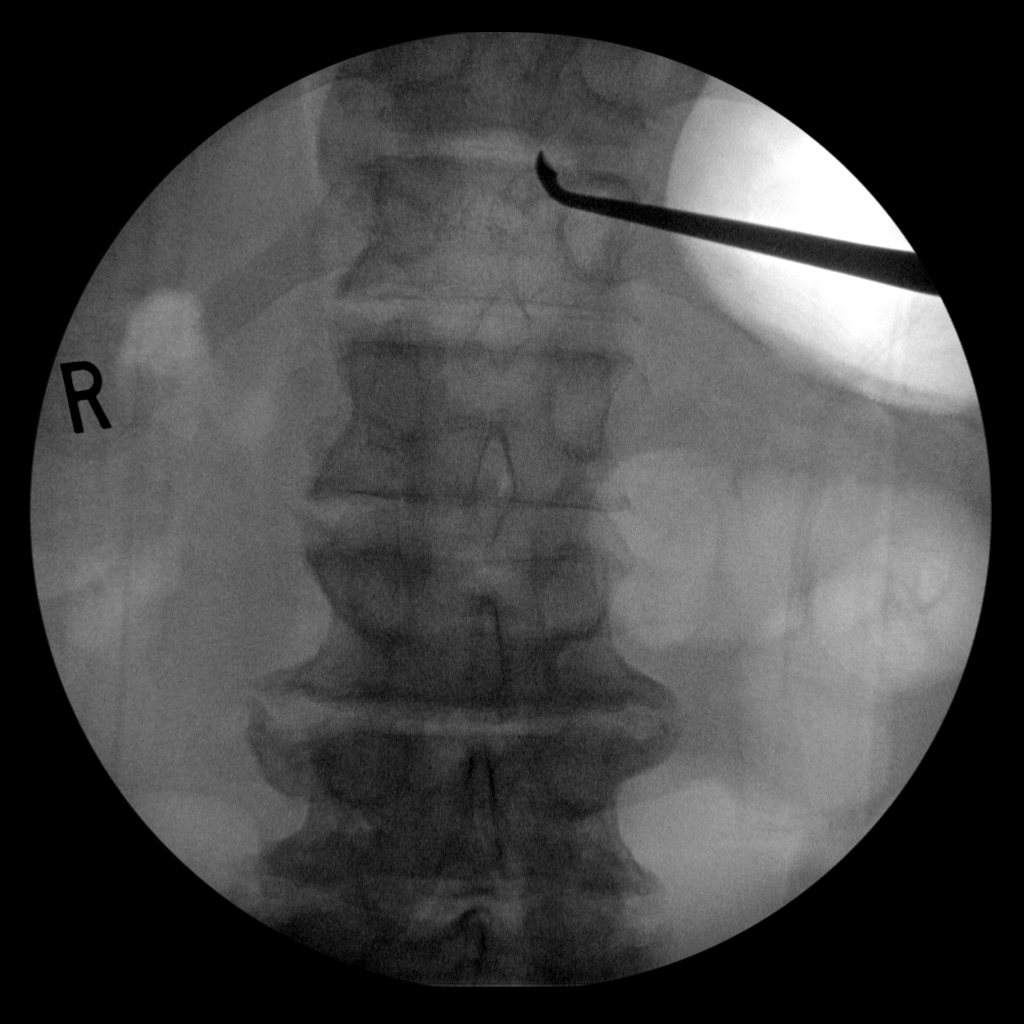
[im 2/2]
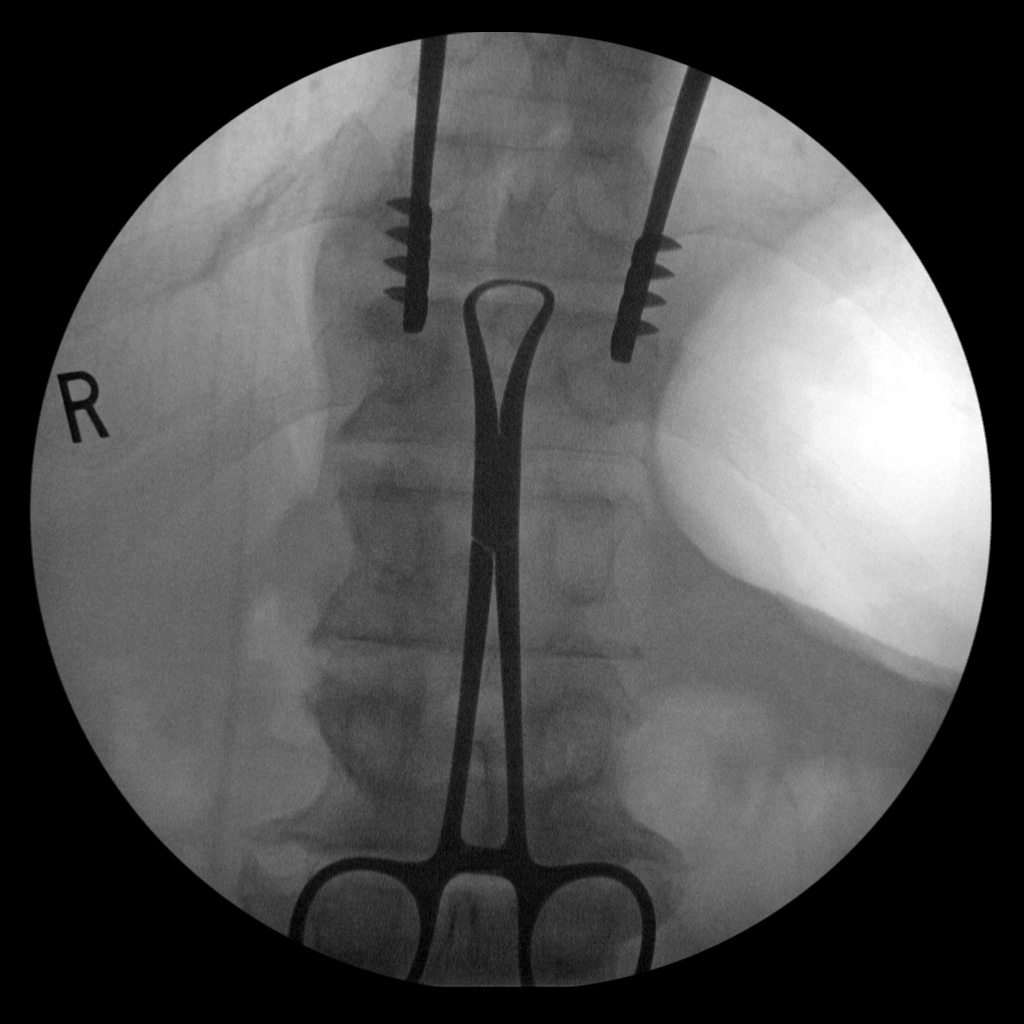

[2 of 2 positions shown; findings below may reference images not displayed]

FINDINGS: Fluoroscopic images were obtained intraoperatively and submitted for
post operative interpretation. One image was obtained for
localization of the T12 vertebral body with 20 seconds of
fluoroscopy time. Please see the performing provider's procedural
report for further detail.
IMPRESSION: Intraoperative fluoroscopy for localization. Please see the
performing provider's procedural report for further detail.

## 2021-08-03 SURGERY — LUMBAR LAMINECTOMY/DECOMPRESSION MICRODISCECTOMY 1 LEVEL
Anesthesia: General

## 2021-08-03 MED ORDER — LOSARTAN POTASSIUM-HCTZ 100-25 MG PO TABS: 1.0000 | ORAL_TABLET | Freq: Every day | ORAL | Status: DC

## 2021-08-03 MED ORDER — ACETAMINOPHEN 650 MG RE SUPP: 650.0000 mg | RECTAL | Status: DC | PRN

## 2021-08-03 MED ORDER — PHENOL 1.4 % MT LIQD: 1.0000 | OROMUCOSAL | Status: DC | PRN

## 2021-08-03 MED ORDER — ORAL CARE MOUTH RINSE: 15.0000 mL | Freq: Once | OROMUCOSAL | Status: AC

## 2021-08-03 MED ORDER — ACETAMINOPHEN 10 MG/ML IV SOLN
INTRAVENOUS | Status: AC
  Filled 2021-08-03: qty 100

## 2021-08-03 MED ORDER — THROMBIN 5000 UNITS EX SOLR
CUTANEOUS | Status: AC
  Filled 2021-08-03: qty 10000

## 2021-08-03 MED ORDER — EPHEDRINE SULFATE-NACL 50-0.9 MG/10ML-% IV SOSY
PREFILLED_SYRINGE | INTRAVENOUS | Status: DC | PRN
  Administered 2021-08-03: 5 mg via INTRAVENOUS

## 2021-08-03 MED ORDER — POTASSIUM CHLORIDE IN NACL 20-0.9 MEQ/L-% IV SOLN: INTRAVENOUS | Status: DC

## 2021-08-03 MED ORDER — MIDAZOLAM HCL 2 MG/2ML IJ SOLN
INTRAMUSCULAR | Status: AC
  Filled 2021-08-03: qty 2

## 2021-08-03 MED ORDER — AMLODIPINE BESYLATE 5 MG PO TABS: 5.0000 mg | ORAL_TABLET | Freq: Every day | ORAL | Status: DC

## 2021-08-03 MED ORDER — ACETAMINOPHEN 10 MG/ML IV SOLN: 1000.0000 mg | Freq: Once | INTRAVENOUS | Status: DC | PRN

## 2021-08-03 MED ORDER — FENTANYL CITRATE (PF) 100 MCG/2ML IJ SOLN
INTRAMUSCULAR | Status: DC | PRN
  Administered 2021-08-03: 100 ug via INTRAVENOUS

## 2021-08-03 MED ORDER — CHLORHEXIDINE GLUCONATE 0.12 % MT SOLN
15.0000 mL | Freq: Once | OROMUCOSAL | Status: AC
  Administered 2021-08-03: 15 mL via OROMUCOSAL
  Filled 2021-08-03: qty 15

## 2021-08-03 MED ORDER — ACETAMINOPHEN 500 MG PO TABS
1000.0000 mg | ORAL_TABLET | Freq: Four times a day (QID) | ORAL | Status: DC
  Administered 2021-08-03: 1000 mg via ORAL
  Administered 2021-08-03 – 2021-08-04 (×2): 500 mg via ORAL
  Filled 2021-08-03 (×3): qty 2

## 2021-08-03 MED ORDER — OXYCODONE HCL 5 MG PO TABS
10.0000 mg | ORAL_TABLET | ORAL | Status: DC | PRN
  Administered 2021-08-03: 10 mg via ORAL
  Filled 2021-08-03: qty 2

## 2021-08-03 MED ORDER — SODIUM CHLORIDE 0.9% FLUSH: 3.0000 mL | INTRAVENOUS | Status: DC | PRN

## 2021-08-03 MED ORDER — OXYCODONE HCL 5 MG PO TABS: 5.0000 mg | ORAL_TABLET | ORAL | Status: DC | PRN

## 2021-08-03 MED ORDER — HEMOSTATIC AGENTS (NO CHARGE) OPTIME
TOPICAL | Status: DC | PRN
  Administered 2021-08-03: 1 via TOPICAL

## 2021-08-03 MED ORDER — ACETAMINOPHEN 500 MG PO TABS: 1000.0000 mg | ORAL_TABLET | Freq: Once | ORAL | Status: DC | PRN

## 2021-08-03 MED ORDER — FENTANYL CITRATE (PF) 250 MCG/5ML IJ SOLN
INTRAMUSCULAR | Status: AC
  Filled 2021-08-03: qty 5

## 2021-08-03 MED ORDER — ATORVASTATIN CALCIUM 10 MG PO TABS: 20.0000 mg | ORAL_TABLET | Freq: Every day | ORAL | Status: DC

## 2021-08-03 MED ORDER — DEXMEDETOMIDINE (PRECEDEX) IN NS 20 MCG/5ML (4 MCG/ML) IV SYRINGE
PREFILLED_SYRINGE | INTRAVENOUS | Status: DC | PRN
  Administered 2021-08-03: 12 ug via INTRAVENOUS

## 2021-08-03 MED ORDER — ONDANSETRON HCL 4 MG PO TABS: 4.0000 mg | ORAL_TABLET | Freq: Four times a day (QID) | ORAL | Status: DC | PRN

## 2021-08-03 MED ORDER — FENTANYL CITRATE (PF) 100 MCG/2ML IJ SOLN: 25.0000 ug | INTRAMUSCULAR | Status: DC | PRN

## 2021-08-03 MED ORDER — ACETAMINOPHEN 160 MG/5ML PO SOLN: 1000.0000 mg | Freq: Once | ORAL | Status: DC | PRN

## 2021-08-03 MED ORDER — PROPOFOL 10 MG/ML IV BOLUS
INTRAVENOUS | Status: DC | PRN
  Administered 2021-08-03: 160 mg via INTRAVENOUS

## 2021-08-03 MED ORDER — THROMBIN 5000 UNITS EX SOLR
CUTANEOUS | Status: DC | PRN
  Administered 2021-08-03 (×2): 5000 [IU] via TOPICAL

## 2021-08-03 MED ORDER — 0.9 % SODIUM CHLORIDE (POUR BTL) OPTIME
TOPICAL | Status: DC | PRN
  Administered 2021-08-03: 1000 mL

## 2021-08-03 MED ORDER — CEFAZOLIN SODIUM-DEXTROSE 2-4 GM/100ML-% IV SOLN
2.0000 g | INTRAVENOUS | Status: AC
  Administered 2021-08-03: 2 g via INTRAVENOUS

## 2021-08-03 MED ORDER — MENTHOL 3 MG MT LOZG: 1.0000 | LOZENGE | OROMUCOSAL | Status: DC | PRN

## 2021-08-03 MED ORDER — HYDROMORPHONE HCL 1 MG/ML IJ SOLN
INTRAMUSCULAR | Status: AC
  Filled 2021-08-03: qty 0.5

## 2021-08-03 MED ORDER — OXYCODONE HCL 5 MG PO TABS: 5.0000 mg | ORAL_TABLET | Freq: Once | ORAL | Status: DC | PRN

## 2021-08-03 MED ORDER — PHENYLEPHRINE HCL-NACL 20-0.9 MG/250ML-% IV SOLN
INTRAVENOUS | Status: DC | PRN
  Administered 2021-08-03: 25 ug/min via INTRAVENOUS

## 2021-08-03 MED ORDER — ONDANSETRON HCL 4 MG/2ML IJ SOLN: 4.0000 mg | Freq: Four times a day (QID) | INTRAMUSCULAR | Status: DC | PRN

## 2021-08-03 MED ORDER — LIDOCAINE-EPINEPHRINE 1 %-1:100000 IJ SOLN
INTRAMUSCULAR | Status: AC
  Filled 2021-08-03: qty 1

## 2021-08-03 MED ORDER — HYDROCHLOROTHIAZIDE 25 MG PO TABS
25.0000 mg | ORAL_TABLET | Freq: Every day | ORAL | Status: DC
  Administered 2021-08-03: 25 mg via ORAL
  Filled 2021-08-03: qty 1

## 2021-08-03 MED ORDER — BUPIVACAINE HCL (PF) 0.5 % IJ SOLN
INTRAMUSCULAR | Status: DC | PRN
  Administered 2021-08-03: 30 mL

## 2021-08-03 MED ORDER — LACTATED RINGERS IV SOLN: INTRAVENOUS | Status: DC

## 2021-08-03 MED ORDER — LIDOCAINE-EPINEPHRINE (PF) 1 %-1:200000 IJ SOLN
INTRAMUSCULAR | Status: DC | PRN
  Administered 2021-08-03: 3 mL

## 2021-08-03 MED ORDER — BUPIVACAINE HCL (PF) 0.5 % IJ SOLN
INTRAMUSCULAR | Status: AC
  Filled 2021-08-03: qty 30

## 2021-08-03 MED ORDER — PROPOFOL 10 MG/ML IV BOLUS
INTRAVENOUS | Status: AC
  Filled 2021-08-03: qty 20

## 2021-08-03 MED ORDER — SUGAMMADEX SODIUM 200 MG/2ML IV SOLN
INTRAVENOUS | Status: DC | PRN
  Administered 2021-08-03: 200 mg via INTRAVENOUS

## 2021-08-03 MED ORDER — ACETAMINOPHEN 10 MG/ML IV SOLN
INTRAVENOUS | Status: DC | PRN
  Administered 2021-08-03: 1000 mg via INTRAVENOUS

## 2021-08-03 MED ORDER — LIDOCAINE 2% (20 MG/ML) 5 ML SYRINGE
INTRAMUSCULAR | Status: DC | PRN
  Administered 2021-08-03: 60 mg via INTRAVENOUS

## 2021-08-03 MED ORDER — SODIUM CHLORIDE 0.9 % IV SOLN
250.0000 mL | INTRAVENOUS | Status: DC
  Administered 2021-08-03: 250 mL via INTRAVENOUS

## 2021-08-03 MED ORDER — OXYCODONE HCL 5 MG/5ML PO SOLN: 5.0000 mg | Freq: Once | ORAL | Status: DC | PRN

## 2021-08-03 MED ORDER — DEXAMETHASONE SODIUM PHOSPHATE 10 MG/ML IJ SOLN
INTRAMUSCULAR | Status: DC | PRN
  Administered 2021-08-03: 10 mg via INTRAVENOUS

## 2021-08-03 MED ORDER — CHLORHEXIDINE GLUCONATE CLOTH 2 % EX PADS: 6.0000 | MEDICATED_PAD | Freq: Once | CUTANEOUS | Status: DC

## 2021-08-03 MED ORDER — ACETAMINOPHEN 325 MG PO TABS: 650.0000 mg | ORAL_TABLET | ORAL | Status: DC | PRN

## 2021-08-03 MED ORDER — ROCURONIUM BROMIDE 10 MG/ML (PF) SYRINGE
PREFILLED_SYRINGE | INTRAVENOUS | Status: DC | PRN
  Administered 2021-08-03: 70 mg via INTRAVENOUS

## 2021-08-03 MED ORDER — HYDROMORPHONE HCL 1 MG/ML IJ SOLN
INTRAMUSCULAR | Status: DC | PRN
  Administered 2021-08-03: .5 mg via INTRAVENOUS

## 2021-08-03 MED ORDER — MIDAZOLAM HCL 5 MG/5ML IJ SOLN
INTRAMUSCULAR | Status: DC | PRN
  Administered 2021-08-03: 2 mg via INTRAVENOUS

## 2021-08-03 MED ORDER — HYDROCODONE-ACETAMINOPHEN 7.5-325 MG PO TABS
1.0000 | ORAL_TABLET | Freq: Four times a day (QID) | ORAL | Status: DC
  Administered 2021-08-03 – 2021-08-04 (×2): 1 via ORAL
  Filled 2021-08-03 (×2): qty 1

## 2021-08-03 MED ORDER — SODIUM CHLORIDE 0.9% FLUSH
3.0000 mL | Freq: Two times a day (BID) | INTRAVENOUS | Status: DC
  Administered 2021-08-03 (×2): 3 mL via INTRAVENOUS

## 2021-08-03 MED ORDER — ONDANSETRON HCL 4 MG/2ML IJ SOLN
INTRAMUSCULAR | Status: DC | PRN
  Administered 2021-08-03: 4 mg via INTRAVENOUS

## 2021-08-03 MED ORDER — LOSARTAN POTASSIUM 50 MG PO TABS
100.0000 mg | ORAL_TABLET | Freq: Every day | ORAL | Status: DC
  Administered 2021-08-03: 100 mg via ORAL
  Filled 2021-08-03: qty 2

## 2021-08-03 MED ORDER — DIAZEPAM 5 MG PO TABS: 5.0000 mg | ORAL_TABLET | Freq: Four times a day (QID) | ORAL | Status: DC | PRN

## 2021-08-03 MED ORDER — CEFAZOLIN SODIUM-DEXTROSE 2-4 GM/100ML-% IV SOLN
INTRAVENOUS | Status: AC
  Filled 2021-08-03: qty 100

## 2021-08-03 SURGICAL SUPPLY — 57 items
ADH SKN CLS APL DERMABOND .7 (GAUZE/BANDAGES/DRESSINGS) ×1
APL SKNCLS STERI-STRIP NONHPOA (GAUZE/BANDAGES/DRESSINGS)
BAG COUNTER SPONGE SURGICOUNT (BAG) ×2 IMPLANT
BAG SPNG CNTER NS LX DISP (BAG) ×1
BAG SURGICOUNT SPONGE COUNTING (BAG) ×1
BAND INSRT 18 STRL LF DISP RB (MISCELLANEOUS) ×2
BAND RUBBER #18 3X1/16 STRL (MISCELLANEOUS) ×6 IMPLANT
BENZOIN TINCTURE PRP APPL 2/3 (GAUZE/BANDAGES/DRESSINGS) IMPLANT
BLADE CLIPPER SURG (BLADE) IMPLANT
BUR MATCHSTICK NEURO 3.0 LAGG (BURR) ×3 IMPLANT
BUR PRECISION FLUTE 5.0 (BURR) IMPLANT
CANISTER SUCT 3000ML PPV (MISCELLANEOUS) ×3 IMPLANT
CARTRIDGE OIL MAESTRO DRILL (MISCELLANEOUS) ×1 IMPLANT
CLOSURE WOUND 1/2 X4 (GAUZE/BANDAGES/DRESSINGS)
DECANTER SPIKE VIAL GLASS SM (MISCELLANEOUS) ×3 IMPLANT
DERMABOND ADVANCED (GAUZE/BANDAGES/DRESSINGS) ×2
DERMABOND ADVANCED .7 DNX12 (GAUZE/BANDAGES/DRESSINGS) ×1 IMPLANT
DIFFUSER DRILL AIR PNEUMATIC (MISCELLANEOUS) ×3 IMPLANT
DRAPE C-ARM 42X72 X-RAY (DRAPES) ×4 IMPLANT
DRAPE LAPAROTOMY 100X72X124 (DRAPES) ×3 IMPLANT
DRAPE MICROSCOPE LEICA (MISCELLANEOUS) ×1 IMPLANT
DRAPE SURG 17X23 STRL (DRAPES) ×3 IMPLANT
DURAPREP 26ML APPLICATOR (WOUND CARE) ×3 IMPLANT
ELECT REM PT RETURN 9FT ADLT (ELECTROSURGICAL) ×3
ELECTRODE REM PT RTRN 9FT ADLT (ELECTROSURGICAL) ×1 IMPLANT
GAUZE 4X4 16PLY ~~LOC~~+RFID DBL (SPONGE) ×2 IMPLANT
GAUZE SPONGE 4X4 12PLY STRL (GAUZE/BANDAGES/DRESSINGS) IMPLANT
GLOVE EXAM NITRILE XL STR (GLOVE) IMPLANT
GLOVE SURG LTX SZ6.5 (GLOVE) ×3 IMPLANT
GLOVE SURG LTX SZ7 (GLOVE) ×2 IMPLANT
GLOVE SURG POLYISO LF SZ7 (GLOVE) ×8 IMPLANT
GLOVE SURG UNDER POLY LF SZ7.5 (GLOVE) ×2 IMPLANT
GOWN STRL REUS W/ TWL LRG LVL3 (GOWN DISPOSABLE) ×2 IMPLANT
GOWN STRL REUS W/ TWL XL LVL3 (GOWN DISPOSABLE) IMPLANT
GOWN STRL REUS W/TWL 2XL LVL3 (GOWN DISPOSABLE) IMPLANT
GOWN STRL REUS W/TWL LRG LVL3 (GOWN DISPOSABLE) ×15
GOWN STRL REUS W/TWL XL LVL3 (GOWN DISPOSABLE)
KIT BASIN OR (CUSTOM PROCEDURE TRAY) ×3 IMPLANT
KIT TURNOVER KIT B (KITS) ×3 IMPLANT
NDL HYPO 25X1 1.5 SAFETY (NEEDLE) ×1 IMPLANT
NDL SPNL 18GX3.5 QUINCKE PK (NEEDLE) IMPLANT
NEEDLE HYPO 25X1 1.5 SAFETY (NEEDLE) ×3 IMPLANT
NEEDLE SPNL 18GX3.5 QUINCKE PK (NEEDLE) ×3 IMPLANT
NS IRRIG 1000ML POUR BTL (IV SOLUTION) ×3 IMPLANT
OIL CARTRIDGE MAESTRO DRILL (MISCELLANEOUS) ×3
PACK LAMINECTOMY NEURO (CUSTOM PROCEDURE TRAY) ×3 IMPLANT
PAD ARMBOARD 7.5X6 YLW CONV (MISCELLANEOUS) ×9 IMPLANT
SPONGE SURGIFOAM ABS GEL SZ50 (HEMOSTASIS) ×3 IMPLANT
SPONGE T-LAP 4X18 ~~LOC~~+RFID (SPONGE) ×2 IMPLANT
STRIP CLOSURE SKIN 1/2X4 (GAUZE/BANDAGES/DRESSINGS) IMPLANT
SUT VIC AB 0 CT1 18XCR BRD8 (SUTURE) ×1 IMPLANT
SUT VIC AB 0 CT1 8-18 (SUTURE) ×3
SUT VIC AB 2-0 CT1 18 (SUTURE) ×3 IMPLANT
SUT VIC AB 3-0 SH 8-18 (SUTURE) ×5 IMPLANT
TOWEL GREEN STERILE (TOWEL DISPOSABLE) ×3 IMPLANT
TOWEL GREEN STERILE FF (TOWEL DISPOSABLE) ×3 IMPLANT
WATER STERILE IRR 1000ML POUR (IV SOLUTION) ×3 IMPLANT

## 2021-08-03 NOTE — Anesthesia Preprocedure Evaluation (Addendum)
Anesthesia Evaluation  Patient identified by MRN, date of birth, ID band Patient awake    Reviewed: Allergy & Precautions, NPO status , Patient's Chart, lab work & pertinent test results  History of Anesthesia Complications Negative for: history of anesthetic complications  Airway Mallampati: I  TM Distance: >3 FB Neck ROM: Full    Dental  (+) Dental Advisory Given, Teeth Intact   Pulmonary neg shortness of breath, neg COPD, neg recent URI, Current Smoker and Patient abstained from smoking.,    breath sounds clear to auscultation       Cardiovascular hypertension, Pt. on medications (-) angina(-) Past MI and (-) CHF  Rhythm:Regular     Neuro/Psych negative neurological ROS  negative psych ROS   GI/Hepatic Neg liver ROS, GERD  ,  Endo/Other  negative endocrine ROS  Renal/GU negative Renal ROS     Musculoskeletal  (+) Arthritis ,   Abdominal   Peds  Hematology negative hematology ROS (+)   Anesthesia Other Findings   Reproductive/Obstetrics                            Anesthesia Physical Anesthesia Plan  ASA: 2  Anesthesia Plan: General   Post-op Pain Management: Ofirmev IV (intra-op)   Induction: Intravenous  PONV Risk Score and Plan: 1 and Ondansetron and Dexamethasone  Airway Management Planned: Oral ETT  Additional Equipment: None  Intra-op Plan:   Post-operative Plan: Extubation in OR  Informed Consent: I have reviewed the patients History and Physical, chart, labs and discussed the procedure including the risks, benefits and alternatives for the proposed anesthesia with the patient or authorized representative who has indicated his/her understanding and acceptance.     Dental advisory given  Plan Discussed with: CRNA and Anesthesiologist  Anesthesia Plan Comments:         Anesthesia Quick Evaluation

## 2021-08-03 NOTE — Anesthesia Procedure Notes (Signed)
Procedure Name: Intubation Date/Time: 08/03/2021 9:48 AM Performed by: Lynnell Chad, CRNA Pre-anesthesia Checklist: Patient identified, Emergency Drugs available, Suction available and Patient being monitored Patient Re-evaluated:Patient Re-evaluated prior to induction Oxygen Delivery Method: Circle System Utilized Preoxygenation: Pre-oxygenation with 100% oxygen Induction Type: IV induction Ventilation: Mask ventilation without difficulty Laryngoscope Size: Glidescope and 4 Tube type: Oral Tube size: 7.5 mm Number of attempts: 1 Airway Equipment and Method: Stylet and Oral airway Placement Confirmation: ETT inserted through vocal cords under direct vision, positive ETCO2 and breath sounds checked- equal and bilateral Secured at: 22 cm Tube secured with: Tape Dental Injury: Teeth and Oropharynx as per pre-operative assessment  Comments: Intubation performed by EMT student

## 2021-08-03 NOTE — H&P (Signed)
BP 133/87   Pulse 83   Temp 97.7 F (36.5 C) (Oral)   Resp 17   Ht 5\' 9"  (1.753 m)   Wt 74.8 kg   SpO2 100%   BMI 24.37 kg/m  Thomas Warner comes in today for evaluation of difficulty with walking.  He has been using a walker now for approximately a month.  He has a markedly abnormal gait, bending over severely at the waist, both feet inverting when he actually walks.  He is not having normal hip/knee coordination.  He is markedly spastic in his gait.  He has been like this, he says, for the last month, but I believe that this has been ongoing for a little bit longer than that.  He had surgery in 2001 by my partner, Dr. 2002, for some problem in his cervical spine.  Neither our records nor records sent with him actually tell me why.  He weighs 166 pounds.  Temperature is 97.5, blood pressure is 118/76, pulse 64.  Pain is 0/10.     SOCIAL HISTORY :  He is disabled.  He does smoke 6 cigarettes a day.  He drinks alcohol socially.  No history of illicit drug use.     MEDICAL HISTORY :  Significant for hypertension and cervical surgery, he says in 1990.  He has problems with blood pressure and cholesterol and takes medication for that.  Mother is 15 years old, in fair health.  Father, 70, is in poor health.  Thomas Warner is 60 years of age.  Hypertension, hypercholesterolemia, diabetes, kidney disease, back problems have been present.  He says his pain is worse due to stiffness.  He has weakness in the legs and hands.  He has very little use of his right hand.  He has swelling in his feet, back pain, leg pain, joint pain.  Coordination is problematic.     PHYSICAL EXAMINATION :  On exam, a few things stand out.  One, he walks markedly antalgically.  He bends over at the waist, but he says he has no pain.  The gait is spastic.  Both feet turn in when he is walking.  He is not able to fully flex either the knee or the hip when he walks.  He is not able to truly clear the floor with his feet when he  walks.  When sitting down on manual exam, he has good strength in the left upper extremity.  He has significant thenar wasting in his right hand.  He has some present in the left hand.  He has almost a claw hand in the right, unable to fully extend his fingers.  His grip is quite poor, 2/5.  Left hand grip is 4/5.  Normal strength.  He has good strength in the lower extremities on manual examination.  Positive Romberg test.  Reflexes are 4+.  He has crossed adductor suprapatellars.  He has finger flexion with brachioradialis, and biceps and triceps are also hyperreflexic.     ASSESSMENT AND PLAN :  I have nothing except a CT of the lumbar spine, which shows severe degenerative change at 2-3, 3-4, 4-5, 5-1, sclerotic bone, anterior and posterior osteophytes.  With regard to his neck, no imaging available.     I did check Tinel sign over the transverse carpal ligament.  He said he felt nothing.  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     I will send him for EMG secondary to bilateral carpal tunnel  syndrome.  I want to see exactly where he is with regard to his hands.  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  For that, I have sent him for an MRI of the cervical spine.  He will receive an MRI of the lumbar spine.  His gait is not due to lumbar stenosis.  I will see him in the office after these tests have been done. The cervical spine MRI, lumbar spine MRI, and thoracic MRI were reviewed.  In the thoracic spine at T10-11, he has cord compression and altered cord signal.  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  He has altered cord signal at C4-5.  I don't see a good reason for the new pain that he has in his shoulder.  There is some foraminal narrowing still noted at C4-5, mildly at C3-4.  But, for right now I do not think I would embark upon any kind of operative decompression.  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  He is mildly stenotic.  He has foraminal narrowing at each level, but I do not think that is something which is causing a new problem at this time, as these are old and established changes.  But, I do believe T10-11 and the stenosis there, along with the altered cord signal, is something that is an active and ongoing process.     PLAN :  Thus, I would like to do a simple decompression at T10-11 to free the canal.  I am not going to try to take out the disc, unless it is an inadequate decompression with just a laminectomy.  Risks and benefits were explained of bleeding, infection, no relief, need for further surgery, no change in the symptoms, damage to the spinal cord, weakness in both lower extremities or one, dysfunction of bowel and bladder.  He understands

## 2021-08-03 NOTE — Transfer of Care (Signed)
Immediate Anesthesia Transfer of Care Note  Patient: Thomas Warner  Procedure(s) Performed: Thoracic Eleven-Twelve  Decompression  Patient Location: PACU  Anesthesia Type:General  Level of Consciousness: drowsy and patient cooperative  Airway & Oxygen Therapy: Patient Spontanous Breathing  Post-op Assessment: Report given to RN and Post -op Vital signs reviewed and stable  Post vital signs: Reviewed and stable  Last Vitals:  Vitals Value Taken Time  BP 101/73 08/03/21 1150  Temp    Pulse 50 08/03/21 1153  Resp 14 08/03/21 1153  SpO2 99 % 08/03/21 1153  Vitals shown include unvalidated device data.  Last Pain:  Vitals:   08/03/21 0705  TempSrc:   PainSc: 6       Patients Stated Pain Goal: 3 (08/03/21 0705)  Complications: No notable events documented.

## 2021-08-03 NOTE — Op Note (Signed)
08/03/2021  5:24 PM  PATIENT:  Thomas Warner  60 y.o. 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 He was positioned prone on a Wilson frame with all pressure points properly padded. His back and thoracic region was prepped and draped in a sterile manner. With fluoroscopy I planned my incision. I infiltrated lidocaine in the planned incision line. I opened the skin with a 10 blade and dissected sharply to the thoracolumbar fascia. I used monopolar cautery to expose the T10 aNd 11 laminae. I confirmed my location with fluoroscopy.  I performed a partial laminectomy of T11, and of T12. Dr. Conchita Paris and I used the drill and Kerrison punches to remove the bone and ligamentum flavum. We decompressed the spinal canal and the thecal sac.  I irrigated then closed the wound in layers. I approximated the thoracolumbar fascia, subcutaneous and subcuticular planes with suture. I placed Dermabond as a dressing over the incision. He was placed supine on the stretcher, and extubated. He was moving all extremities post op. Weakness in right lower extremity  PLAN OF CARE: Admit for overnight observation  PATIENT DISPOSITION:  PACU - hemodynamically stable.   Delay start of Pharmacological VTE agent (>24hrs) due to surgical blood loss or risk of bleeding:  no

## 2021-08-04 DIAGNOSIS — M4804 Spinal stenosis, thoracic region: Secondary | ICD-10-CM | POA: Diagnosis not present

## 2021-08-04 MED ORDER — HYDROCODONE-ACETAMINOPHEN 7.5-325 MG PO TABS: 1.0000 | ORAL_TABLET | ORAL | 0 refills | Status: AC | PRN

## 2021-08-04 NOTE — Anesthesia Postprocedure Evaluation (Signed)
Anesthesia Post Note  Patient: Thomas Warner  Procedure(s) Performed: Thoracic Eleven-Twelve  Decompression     Patient location during evaluation: PACU Anesthesia Type: General Level of consciousness: awake and alert Pain management: pain level controlled Vital Signs Assessment: post-procedure vital signs reviewed and stable Respiratory status: spontaneous breathing, nonlabored ventilation, respiratory function stable and patient connected to nasal cannula oxygen Cardiovascular status: blood pressure returned to baseline and stable Postop Assessment: no apparent nausea or vomiting Anesthetic complications: no   No notable events documented.  Last Vitals:  Vitals:   08/04/21 0524 08/04/21 0725  BP: 116/78 108/72  Pulse: (!) 57 63  Resp: 18 16  Temp: 36.6 C 36.9 C  SpO2: 100% 100%    Last Pain:  Vitals:   08/04/21 0725  TempSrc: Oral  PainSc:                  Whittney Steenson

## 2021-08-04 NOTE — Evaluation (Signed)
Physical Therapy Evaluation Patient Details Name: Thomas Warner MRN: [id number] DOB: May 15, 1961 Today's Date: 08/04/2021  History of Present Illness  Pt is a 60 y.o. M who presents s/p T11-12 decompression via laminectomy. Significant PMH: prior cervical surgery.  Clinical Impression   Pt admitted s/p procedure listed above. He reports improvement in pain and truncal extension post op. Pt continues to present with decreased gross motor coordination, gait abnormalities, and balance deficits. Ambulating 200 feet with a walker at a min guard assist level. Negotiated a half flight of stairs with railings to prepare for discharge to sister's house today. Recommend HHPT at discharge to address deficits and maximize functional mobility; will likely then need transition to OPPT (neuro).     Recommendations for follow up therapy are one component of a multi-disciplinary discharge planning process, led by the attending physician.  Recommendations may be updated based on patient status, additional functional criteria and insurance authorization.  Follow Up Recommendations Home health PT    Assistance Recommended at Discharge PRN  Functional Status Assessment Patient has had a recent decline in their functional status and demonstrates the ability to make significant improvements in function in a reasonable and predictable amount of time.   Equipment Recommendations  Rolling walker (2 wheels)    Recommendations for Other Services       Precautions / Restrictions Precautions Precautions: Back;Fall Precaution Booklet Issued: Yes (comment) Precaution Comments: verbally reviewed, provided written handout Restrictions Weight Bearing Restrictions: No      Mobility  Bed Mobility Overal bed mobility: Modified Independent             General bed mobility comments: cues for log roll technique. increased time/effort    Transfers Overall transfer level: Needs assistance Equipment used:  Rolling walker (2 wheels) Transfers: Sit to/from Stand Sit to Stand: Supervision                Ambulation/Gait Ambulation/Gait assistance: Min guard Gait Distance (Feet): 200 Feet Assistive device: Rolling walker (2 wheels) Gait Pattern/deviations: Step-through pattern;Decreased dorsiflexion - right;Decreased dorsiflexion - left;Knee hyperextension - right;Knee hyperextension - left;Scissoring;Trunk flexed;Narrow base of support Gait velocity: decreased Gait velocity interpretation: <1.8 ft/sec, indicate of risk for recurrent falls   General Gait Details: Cues for walker proximity and wider BOS. Pt demonstrates decreased bilateral heel strike at initial contact (R> L, tends to walk on toes on R), scissoring, narrow BOS, bilateral knee hyperextension.  Stairs Stairs: Yes Stairs assistance: Min guard Stair Management: Two rails Number of Stairs: 10 General stair comments: Cues for step by step, feet fully on each step. Min guard for Advertising account planner    Modified Rankin (Stroke Patients Only)       Balance Overall balance assessment: Needs assistance Sitting-balance support: Feet supported Sitting balance-Leahy Scale: Good     Standing balance support: Bilateral upper extremity supported Standing balance-Leahy Scale: Poor Standing balance comment: reliant on RW                             Pertinent Vitals/Pain Pain Assessment: Faces Faces Pain Scale: Hurts a little bit Pain Location: incisional Pain Descriptors / Indicators: Sore Pain Intervention(s): Monitored during session    Home Living Family/patient expects to be discharged to:: Private residence Living Arrangements: Other relatives (sister) Available Help at Discharge: Family Type of Home: House Home Access: Stairs to enter   Secretary/administrator of Steps: 3 Alternate Level Stairs-Number of Steps: 12 Home Layout: Two  level Home Equipment: Agricultural consultant (2 wheels);Cane -  single point Additional Comments: Will d/c home to sister's house initially    Prior Function Prior Level of Function : Independent/Modified Independent             Mobility Comments: using walker x 8 months       Hand Dominance        Extremity/Trunk Assessment   Upper Extremity Assessment Upper Extremity Assessment: Defer to OT evaluation    Lower Extremity Assessment Lower Extremity Assessment: RLE deficits/detail;LLE deficits/detail RLE Deficits / Details: Strength 5/5 RLE Coordination: decreased gross motor LLE Deficits / Details: Strength 5/5 LLE Coordination: decreased gross motor    Cervical / Trunk Assessment Cervical / Trunk Assessment: Back Surgery  Communication   Communication: No difficulties  Cognition Arousal/Alertness: Awake/alert Behavior During Therapy: WFL for tasks assessed/performed Overall Cognitive Status: Within Functional Limits for tasks assessed                                          General Comments      Exercises     Assessment/Plan    PT Assessment Patient does not need any further PT services  PT Problem List         PT Treatment Interventions      PT Goals (Current goals can be found in the Care Plan section)  Acute Rehab PT Goals Patient Stated Goal: improve walking PT Goal Formulation: All assessment and education complete, DC therapy    Frequency     Barriers to discharge        Co-evaluation               AM-PAC PT "6 Clicks" Mobility  Outcome Measure Help needed turning from your back to your side while in a flat bed without using bedrails?: None Help needed moving from lying on your back to sitting on the side of a flat bed without using bedrails?: None Help needed moving to and from a bed to a chair (including a wheelchair)?: A Little Help needed standing up from a chair using your arms (e.g., wheelchair or bedside chair)?: A Little Help needed to walk in hospital room?: A  Little Help needed climbing 3-5 steps with a railing? : A Little 6 Click Score: 20    End of Session Equipment Utilized During Treatment: Gait belt Activity Tolerance: Patient tolerated treatment well Patient left: in bed;with call bell/phone within reach Nurse Communication: Mobility status PT Visit Diagnosis: Unsteadiness on feet (R26.81);Other abnormalities of gait and mobility (R26.89);Difficulty in walking, not elsewhere classified (R26.2);Pain Pain - part of body:  (back)    Time: 7543-6067 PT Time Calculation (min) (ACUTE ONLY): 20 min   Charges:   PT Evaluation $PT Eval Low Complexity: 1 Low          Lillia Pauls, PT, DPT Acute Rehabilitation Services Pager [phone] Office [phone]   Norval Morton 08/04/2021, 8:41 AM

## 2021-08-04 NOTE — Progress Notes (Signed)
Patient alert and oriented, mae's well, voiding adequate amount of urine, swallowing without difficulty, no c/o pain at time of discharge. Patient discharged home with family. Script and discharged instructions given to patient. Patient and family stated understanding of instructions given. Patient has an appointment with Dr. Cabbell   

## 2021-08-04 NOTE — TOC Transition Note (Signed)
Transition of Care [facility]) - CM/SW Discharge Note   Patient Details  Name: Thomas Warner MRN: [id number] Date of Birth: 05-04-1961  Transition of [facility]) CM/SW Contact:  Lawerance Sabal, RN Phone Number: 08/04/2021, 9:11 AM   Clinical Narrative:    Spoke w patient at bedside. He states that he will be going home to stay at his sister's house initially after DC in [place]. Discussed HH recs. He is agreeable to any company that accepts his insurance. [facility] contacted and is available to [facility] within 48 hours in [place] area. Unit staff tp provide any needed DME for DC.  No other CM needs identified.     Final next level of care: Home w Home Health Services Barriers to Discharge: No Barriers Identified   Patient Goals and CMS Choice Patient states their goals for this hospitalization and ongoing recovery are:: to go home CMS Medicare.gov Compare Post Acute Care list provided to:: Patient Choice offered to / list presented to : Patient  Discharge Placement                       Discharge Plan and Services                          HH Arranged: PT, OT [facility] Agency: [facility] Date [facility] Agency Contacted: 08/04/21 Time HH Agency Contacted: 0911 Representative spoke with at [facility] Agency: [place]  Social Determinants of Health (SDOH) Interventions     Readmission Risk Interventions No flowsheet data found.

## 2021-08-04 NOTE — Discharge Summary (Signed)
Physician Discharge Summary  Patient ID: Thomas Warner MRN: [id number] DOB/AGE: 12-08-1960 60 y.o.  Admit date: 08/03/2021 Discharge date: 08/04/2021  Admission Diagnoses: Thoracic spinal stenosis  Discharge Diagnoses: The same Principal Problem:   Thoracic spinal stenosis   Discharged Condition: good  Hospital Course: Dr. Franky Macho performed a thoracic laminectomy on the patient on 08/03/2021.  The patient's postoperative course was unremarkable.  On postoperative day #1 he requested discharge home.  He was given written and verbal discharge instructions.  All his questions were answered.  Consults: PT, OT, care management Significant Diagnostic Studies: None Treatments: Thoracic laminectomy Discharge Exam: Blood pressure 108/72, pulse 63, temperature 98.4 F (36.9 C), temperature source Oral, resp. rate 16, height 5\' 9"  (1.753 m), weight 74.8 kg, SpO2 100 %. The patient is alert and pleasant.  His lower extremity strength is normal.  His dressing is clean and dry.  Disposition: Home  Discharge Instructions     Call MD for:  difficulty breathing, headache or visual disturbances   Complete by: As directed    Call MD for:  extreme fatigue   Complete by: As directed    Call MD for:  hives   Complete by: As directed    Call MD for:  persistant dizziness or light-headedness   Complete by: As directed    Call MD for:  persistant nausea and vomiting   Complete by: As directed    Call MD for:  redness, tenderness, or signs of infection (pain, swelling, redness, odor or green/yellow discharge around incision site)   Complete by: As directed    Call MD for:  severe uncontrolled pain   Complete by: As directed    Call MD for:  temperature >100.4   Complete by: As directed    Diet - low sodium heart healthy   Complete by: As directed    Discharge instructions   Complete by: As directed    Call [phone] for a followup appointment. Take a stool softener while you are using  pain medications.   Driving Restrictions   Complete by: As directed    Do not drive for 2 weeks.   Increase activity slowly   Complete by: As directed    Lifting restrictions   Complete by: As directed    Do not lift more than 5 pounds. No excessive bending or twisting.   May shower / Bathe   Complete by: As directed    Remove the dressing for 3 days after surgery.  You may shower, but leave the incision alone.   Remove dressing in 48 hours   Complete by: As directed       Allergies as of 08/04/2021   No Known Allergies      Medication List     TAKE these medications    amLODipine 5 MG tablet Commonly known as: NORVASC Take 5 mg by mouth daily.   atorvastatin 20 MG tablet Commonly known as: LIPITOR Take 20 mg by mouth daily.   HYDROcodone-acetaminophen 7.5-325 MG tablet Commonly known as: NORCO Take 1 tablet by mouth every 4 (four) hours as needed for moderate pain.   losartan-hydrochlorothiazide 100-25 MG tablet Commonly known as: HYZAAR Take 1 tablet by mouth daily.   multivitamin with minerals tablet Take 1 tablet by mouth 3 (three) times a week.               Durable Medical Equipment  (From admission, onward)           Start  Ordered   08/04/21 0759  For home use only DME Walker rolling  Once       Question Answer Comment  Walker: With 5 Inch Wheels   Patient needs a walker to treat with the following condition Unsteady gait when walking      08/04/21 0800   08/03/21 1537  For home use only DME 3 n 1  Once        08/03/21 1536             Signed: Cristi Loron 08/04/2021, 8:36 AM

## 2021-08-04 NOTE — Evaluation (Addendum)
Occupational Therapy Evaluation Patient Details Name: Thomas Warner MRN: [id number] DOB: 03-03-1961 Today's Date: 08/04/2021   History of Present Illness Pt is a 60 y.o. M who presents s/p T11-12 decompression via laminectomy. Significant PMH: prior cervical surgery.   Clinical Impression   Patient admitted for the diagnosis above.  PTA he lives alone, and used a RW for mobility.  He plans on discharging home with his sister, and will have assist as needed.  Patient able to complete ADL from sit/stand level, he is close to his baseline.  All questions answered, and no further acute OT needs identified.        Recommendations for follow up therapy are one component of a multi-disciplinary discharge planning process, led by the attending physician.  Recommendations may be updated based on patient status, additional functional criteria and insurance authorization.   Follow Up Recommendations  HH OT follow up    Assistance Recommended at Discharge Set up Supervision/Assistance  Functional Status Assessment  Patient has not had a recent decline in their functional status  Equipment Recommendations  None recommended by OT    Recommendations for Other Services       Precautions / Restrictions Precautions Precautions: Back;Fall Precaution Booklet Issued: Yes (comment) Precaution Comments: verbally reviewed, provided written handout Restrictions Weight Bearing Restrictions: No      Mobility Bed Mobility Overal bed mobility: Modified Independent             General bed mobility comments: continued cues    Transfers Overall transfer level: Needs assistance Equipment used: Rolling walker (2 wheels) Transfers: Sit to/from Stand Sit to Stand: Supervision                  Balance Overall balance assessment: Needs assistance Sitting-balance support: Feet supported Sitting balance-Leahy Scale: Good     Standing balance support: Reliant on assistive device for  balance Standing balance-Leahy Scale: Poor Standing balance comment: reliant on RW                           ADL either performed or assessed with clinical judgement   ADL Overall ADL's : At baseline                                             Vision Patient Visual Report: No change from baseline       Perception Perception Perception: Not tested   Praxis Praxis Praxis: Not tested    Pertinent Vitals/Pain Pain Assessment: Faces Faces Pain Scale: Hurts a little bit Pain Location: incisional Pain Descriptors / Indicators: Sore Pain Intervention(s): Monitored during session     Hand Dominance Right   Extremity/Trunk Assessment Upper Extremity Assessment Upper Extremity Assessment: Overall WFL for tasks assessed   Lower Extremity Assessment Lower Extremity Assessment: Defer to PT evaluation Cervical / Trunk Assessment Cervical / Trunk Assessment: Back Surgery   Communication Communication Communication: No difficulties   Cognition Arousal/Alertness: Awake/alert Behavior During Therapy: WFL for tasks assessed/performed Overall Cognitive Status: Within Functional Limits for tasks assessed                                       General Comments       Exercises     Shoulder Instructions  Home Living Family/patient expects to be discharged to:: Private residence Living Arrangements: Other relatives Available Help at Discharge: Family Type of Home: House Home Access: Stairs to enter Secretary/administrator of Steps: 3   Home Layout: Two level Alternate Level Stairs-Number of Steps: 12   Bathroom Shower/Tub: Chief Strategy Officer: Standard     Home Equipment: Agricultural consultant (2 wheels);Cane - single point   Additional Comments: Will d/c home to sister's house initially      Prior Functioning/Environment Prior Level of Function : Independent/Modified Independent             Mobility  Comments: using walker x 8 months ADLs Comments: No assist        OT Problem List: Impaired balance (sitting and/or standing)      OT Treatment/Interventions:      OT Goals(Current goals can be found in the care plan section) Acute Rehab OT Goals Patient Stated Goal: return home OT Goal Formulation: With patient Time For Goal Achievement: 08/06/21 Potential to Achieve Goals: Good  OT Frequency:     Barriers to D/C:  None noted          Co-evaluation              AM-PAC OT "6 Clicks" Daily Activity     Outcome Measure Help from another person eating meals?: None Help from another person taking care of personal grooming?: None Help from another person toileting, which includes using toliet, bedpan, or urinal?: None Help from another person bathing (including washing, rinsing, drying)?: None Help from another person to put on and taking off regular upper body clothing?: None Help from another person to put on and taking off regular lower body clothing?: None 6 Click Score: 24   End of Session Equipment Utilized During Treatment: Rolling walker (2 wheels)  Activity Tolerance: Patient tolerated treatment well Patient left: in chair;with call bell/phone within reach  OT Visit Diagnosis: Unsteadiness on feet (R26.81)                Time: 7989-2119 OT Time Calculation (min): 17 min Charges:  OT General Charges $OT Visit: 1 Visit OT Evaluation $OT Eval Moderate Complexity: 1 Mod  08/04/2021  RP, OTR/L  Acute Rehabilitation Services  Office:  [phone]   Suzanna Obey 08/04/2021, 9:09 AM

## 2021-08-05 ENCOUNTER — Encounter (HOSPITAL_COMMUNITY): Payer: Self-pay | Admitting: Neurosurgery

## 2021-11-29 ENCOUNTER — Ambulatory Visit: Payer: Medicare HMO

## 2021-12-12 NOTE — Therapy (Signed)
?OUTPATIENT PHYSICAL THERAPY THORACOLUMBAR EVALUATION ? ? ?Patient Name: Thomas Warner ?MRN: [id number] ?DOB:1961-08-18, 61 y.o., male ?Today's Date: 12/14/2021 ? ? PT End of Session - 12/13/21 1113   ? ? Visit Number 1   ? Number of Visits 17   ? Date for PT Re-Evaluation 02/15/22   ? Authorization Type HUMANA MEDICARE HMO; MEDICAID OF [place]   ? PT Start Time 1108   ? PT Stop Time 1150   ? PT Time Calculation (min) 42 min   ? Equipment Utilized During Treatment Other (comment)   RW  ? Activity Tolerance Patient tolerated treatment well   ? Behavior During Therapy [facility] for tasks assessed/performed   ? ?  ?  ? ?  ? ? ?Past Medical History:  ?Diagnosis Date  ? Arthritis   ? GERD (gastroesophageal reflux disease)   ? Hypertension   ? ?Past Surgical History:  ?Procedure Laterality Date  ? LUMBAR LAMINECTOMY/DECOMPRESSION MICRODISCECTOMY N/A 08/03/2021  ? Procedure: Thoracic Eleven-Twelve  Decompression;  Surgeon: Coletta Memos, MD;  Location: [facility] OR;  Service: Neurosurgery;  Laterality: N/A;  3C/RM 20  ? NECK SURGERY    ? approx 2000, plate and screws  ? ?Patient Active Problem List  ? Diagnosis Date Noted  ? Thoracic spinal stenosis 08/03/2021  ? ? ?PCP: Stevphen Rochester, MD ? ?REFERRING PROVIDER: Stevphen Rochester, MD ? ?REFERRING DIAG: Spinal stenosis of thoracic region ? ?THERAPY DIAG:  ?Difficulty in walking, not elsewhere classified ? ?Muscle weakness (generalized) ? ?Stiffness of left ankle, not elsewhere classified ? ?Stiffness of right ankle, not elsewhere classified ? ?ONSET DATE: 08/03/22 ? ?SUBJECTIVE:                                                                                                                                                                                          ? ?SUBJECTIVE STATEMENT: ?After my last surgery my back pain improved significantly, but my ability to walk has been decreased from about a month prior to the surgery up to now. ?  ?PERTINENT HISTORY:  ?Thoracic Eleven-Twelve   Decompression via laminectomy T11, t12; 08/03/21; Status post ACDF C3-C5 aprox 2000 ? ?PAIN:  ?Are you having pain? Yes: NPRS scale: 0/10 ?Pain location: back ?Pain description: ache ?Aggravating factors: prolonged walking ?Relieving factors: rest ? ? ?PRECAUTIONS: None ? ?WEIGHT BEARING RESTRICTIONS No ? ?FALLS:  ?Has patient fallen in last 6 months? No ? ?LIVING ENVIRONMENT: ?Lives with: lives with their family ?Lives in: House/apartment ?Stairs: Yes: Internal: 13 steps; on right going up and External: 3 steps; none ?Has following equipment at home: Dan Humphreys - 2 wheeled ? ?OCCUPATION: Disability ? ?PLOF:  Independent with community mobility with device ? ?PATIENT GOALS To walk without the walker, help friends work on cars ? ? ?OBJECTIVE:  ? ?DIAGNOSTIC FINDINGS:  ?  On: 06/23/2021 ?IMPRESSION: ?Cervical spine: ?  ?1. 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 ?2. Status post ACDF C3-C5. Congenitally short pedicles lead to at ?least mild spinal canal stenosis most cervical levels, including the ?fused levels. ?3. Multilevel facet and uncovertebral hypertrophy, worst at C6-C7, ?where it is severe bilaterally, and C5-C6, where it is moderate to ?severe bilaterally. ?  ?Thoracic spine: ?  ?1. T11-T12 severe spinal canal stenosis and severe bilateral neural ?foraminal narrowing. Increased T2 signal within the spinal cord at ?this level without apparent cord expansion, which may represent cord ?edema. ?2. Moderate to severe bilateral neural foraminal narrowing at T9-T10 ?and T10-T11. ?3. Moderate bilateral neural foraminal narrowing at T1-T2, T2-T3, ?and T3-T4. ?  ?Lumbar spine: ?  ?1. L2-L3 and L3-L4 moderate to severe thecal sac narrowing, ?secondary to severe disc height loss, disc osteophyte complexes, ?disc bulges, and epidural lipomatosis. L2-L3 moderate bilateral ?neural foraminal narrowing and L3-L4 moderate to severe bilateral ?neural foraminal narrowing. ?2. L4-L5  moderate thecal sac narrowing, due to the aforementioned ?factors, severe left and moderate to severe right neural foraminal ?narrowing. ?3. L5-S1 severe bilateral neural foraminal narrowing ?  ? ?PATIENT SURVEYS:  ?FOTO 56% ? ?SCREENING FOR RED FLAGS: ?Bowel or bladder incontinence: No ?Spinal tumors: No ?Cauda equina syndrome: No ?Compression fracture: No ? ?COGNITION: ? Overall cognitive status: Within functional limits for tasks assessed   ?  ?SENSATION: ?Not tested Pt reports the sensation of walking on bubble wrap on the bottom of his feet ? ?MUSCLE LENGTH: ?Hamstrings: Right 35 deg; Left 38 deg ?Tight hip flexors, hip adductors, heel cords bilaterally ? ?POSTURE:  ?Forward flexed trunk ? ?PALPATION: ?NT ? ?LE MMT: ? ?MMT Right ?12/14/2021 Left ?12/14/2021  ?Hip flexion 3+ 3  ?Hip extension 2 2  ?Hip abduction 2 2  ?Hip adduction 4 4  ?Hip internal rotation 4 4  ?Hip external rotation 2 2  ?Knee flexion 3 3  ?Knee extension 4 4  ?Ankle dorsiflexion 2 2  ?Ankle plantarflexion 3 3  ?Ankle inversion    ?Ankle eversion    ? (Blank rows = not tested) ? ?FUNCTIONAL TESTS:  ?5 times sit to stand: 26.4 s use of hands ?2 minute walk test: 16006ft ? ?GAIT: ?Distance walked: 106 ?Assistive device utilized: Environmental consultantWalker - 4 wheeled ?Level of assistance: Complete Independence ?Comments: gait deficits include forward flexed trunk, scissor gait, drop foot, genu recurvatum in    stance phase ? ?TODAY'S TREATMENT  ?Sit to Stand Without Arm Support  10 reps - 3 hold ?Heel Raises with Counter Support  10 reps - 3 hold ?Seated Heel Toe Raises 10 reps - 2 hold ?Hooklying Clamshell with Resistance 10 reps - 3 hold  ? ?PATIENT EDUCATION:  ?Education details: Eval findings, POC, HEP ?Person educated: Patient ?Education method: Explanation, Demonstration, Tactile cues, Verbal cues, and Handouts ?Education comprehension: verbalized understanding, returned demonstration, verbal cues required, tactile cues required, and needs further  education ? ? ?HOME EXERCISE PROGRAM: ?Access Code: [email or web address] ?URL: [email or web address] ?Date: 12/14/2021 ?Prepared by: Joellyn RuedAllen Myla Mauriello ? ?Exercises ?- Sit to Stand Without Arm Support  - 1 x daily - 7 x weekly - 3 sets - 10 reps - 3 hold ?- Heel Raises with Counter Support  - 1 x daily - 7 x weekly - 3 sets -  10 reps - 3 hold ?- Seated Heel Toe Raises  - 1 x daily - 7 x weekly - 3 sets - 10 reps - 2 hold ?- Hooklying Clamshell with Resistance  - 1 x daily - 7 x weekly - 3 sets - 10 reps - 3 hold ? ?ASSESSMENT: ? ?CLINICAL IMPRESSION: ?Patient is a 61 y.o. M who was seen today for physical therapy evaluation and treatment for Spinal stenosis of thoracic region.  ? ? ?OBJECTIVE IMPAIRMENTS Abnormal gait, decreased activity tolerance, decreased balance, difficulty walking, decreased ROM, decreased strength, impaired flexibility, and postural dysfunction.  ? ?ACTIVITY LIMITATIONS cleaning, community activity, meal prep, yard work, shopping, and hobbies .  ? ?PERSONAL FACTORS Past/current experiences, Time since onset of injury/illness/exacerbation, and 1-2 comorbidities:    arthritis and previous neck surgery around 2000 are also affecting patient's functional outcome. ? ?REHAB POTENTIAL: Good ? ?CLINICAL DECISION MAKING: Evolving/moderate complexity ? ?EVALUATION COMPLEXITY: Moderate ? ? ?GOALS: ? ?SHORT TERM GOALS: Target date: 12/28/2021 ? ?Pt will be Ind in an initial HEP ?Baseline:started on eval ?Goal status: INITIAL ? ?LONG TERM GOALS: Target date: 02/15/22 ? ?Increase pt's 2 min walking test to 285ft or more as indication of improved function ?Baseline: 106 c RW ?Goal status: INITIAL ? ?2.  Decreased pt's 5xSTS time to 20" or less s use of hands ?Baseline: 26.4 sec without use of hands ?Goal status: INITIAL ? ?3.  Increase pt's hip strength to at least 3+/5 and knee strength to at least 4+/5 for improved function of the LEs     ?Baseline: See flow sheets ?Goal status: INITIAL ? ?4.  Pt will demonstrate  improve gait walking with a LRAD, improved gait pattern, and for an increased distance of 463ft ?Baseline: 106 ft c RW ?Goal status: INITIAL ? ?5.  Pt will be Ind in a final HEP to maintain achieved LOF ?Baseline: sta

## 2021-12-13 ENCOUNTER — Ambulatory Visit: Payer: Medicare HMO | Attending: Family Medicine

## 2021-12-13 DIAGNOSIS — M25671 Stiffness of right ankle, not elsewhere classified: Secondary | ICD-10-CM

## 2021-12-13 DIAGNOSIS — R262 Difficulty in walking, not elsewhere classified: Secondary | ICD-10-CM

## 2021-12-13 DIAGNOSIS — M25672 Stiffness of left ankle, not elsewhere classified: Secondary | ICD-10-CM

## 2021-12-13 DIAGNOSIS — M6281 Muscle weakness (generalized): Secondary | ICD-10-CM | POA: Diagnosis present

## 2021-12-18 NOTE — Therapy (Signed)
?OUTPATIENT PHYSICAL THERAPY TREATMENT NOTE ? ? ?Patient Name: Thomas Warner ?MRN: [id number] ?DOB:04-07-1961, 61 y.o., male ?Today's Date: 12/20/2021 ? ?PCP: Stevphen Rochester, MD ?REFERRING PROVIDER: Coletta Memos, MD ? ?END OF SESSION:  ? PT End of Session - 12/19/21 1556   ? ? Visit Number 2   ? Number of Visits 17   ? Date for PT Re-Evaluation 02/15/22   ? Authorization Type HUMANA MEDICARE HMO; MEDICAID OF [place]   ? PT Start Time 1550   ? PT Stop Time 1630   ? PT Time Calculation (min) 40 min   ? Activity Tolerance Patient tolerated treatment well   ? Behavior During Therapy [facility] for tasks assessed/performed   ? ?  ?  ? ?  ? ? ?Past Medical History:  ?Diagnosis Date  ? Arthritis   ? GERD (gastroesophageal reflux disease)   ? Hypertension   ? ?Past Surgical History:  ?Procedure Laterality Date  ? LUMBAR LAMINECTOMY/DECOMPRESSION MICRODISCECTOMY N/A 08/03/2021  ? Procedure: Thoracic Eleven-Twelve  Decompression;  Surgeon: Coletta Memos, MD;  Location: [facility] OR;  Service: Neurosurgery;  Laterality: N/A;  3C/RM 20  ? NECK SURGERY    ? approx 2000, plate and screws  ? ?Patient Active Problem List  ? Diagnosis Date Noted  ? Thoracic spinal stenosis 08/03/2021  ? ? ?REFERRING DIAG: spinal stenosis  ? ?THERAPY DIAG:  ?Difficulty in walking, not elsewhere classified ? ?Muscle weakness (generalized) ? ?Stiffness of left ankle, not elsewhere classified ? ?Stiffness of right ankle, not elsewhere classified ? ?PERTINENT HISTORY: Dr. Franky Macho performed a thoracic laminectomy on the patient on 08/03/2021. ?Cervical fusion 20 yrs ago ? ? ?PRECAUTIONS: spasticity, abnormal gait and LE weakness ? ?SUBJECTIVE: I am doing OK.  Pt reports no pain , just difficulty walking, often steps on his feet.  He has been doing the exercises . He cant stand up straight but not because of pain , it is the stiffness. Unable to lift Rt heel off the ground at all even in sitting.   ? ?PAIN:  ?Are you having pain? No ? ? ?DIAGNOSTIC FINDINGS:  ?  On:  06/23/2021 ?IMPRESSION: ?Cervical spine: ?  ?1. 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 ?2. Status post ACDF C3-C5. Congenitally short pedicles lead to at ?least mild spinal canal stenosis most cervical levels, including the ?fused levels. ?3. Multilevel facet and uncovertebral hypertrophy, worst at C6-C7, ?where it is severe bilaterally, and C5-C6, where it is moderate to ?severe bilaterally. ?  ?Thoracic spine: ?  ?1. T11-T12 severe spinal canal stenosis and severe bilateral neural ?foraminal narrowing. Increased T2 signal within the spinal cord at ?this level without apparent cord expansion, which may represent cord ?edema. ?2. Moderate to severe bilateral neural foraminal narrowing at T9-T10 ?and T10-T11. ?3. Moderate bilateral neural foraminal narrowing at T1-T2, T2-T3, ?and T3-T4. ?  ?Lumbar spine: ?  ?1. L2-L3 and L3-L4 moderate to severe thecal sac narrowing, ?secondary to severe disc height loss, disc osteophyte complexes, ?disc bulges, and epidural lipomatosis. L2-L3 moderate bilateral ?neural foraminal narrowing and L3-L4 moderate to severe bilateral ?neural foraminal narrowing. ?2. L4-L5 moderate thecal sac narrowing, due to the aforementioned ?factors, severe left and moderate to severe right neural foraminal ?narrowing. ?3. L5-S1 severe bilateral neural foraminal narrowing ?  ?  ?PATIENT SURVEYS:  ?FOTO 56% ?  ?SCREENING FOR RED FLAGS: ?Bowel or bladder incontinence: No ?Spinal tumors: No ?Cauda equina syndrome: No ?Compression fracture: No ?  ?COGNITION: ?  Overall cognitive status: Within functional limits for tasks assessed               ?           ?SENSATION: ?Not tested Pt reports the sensation of walking on bubble wrap on the bottom of his feet.  This is increased after the exercises.  ?  ?MUSCLE LENGTH: ?Hamstrings: Right 35 deg; Left 38 deg ?Tight hip flexors, hip adductors, heel cords bilaterally ?  ?POSTURE:  ?Forward flexed  trunk ?  ?PALPATION: ?NT ?  ?LE MMT: ?  ?MMT Right ?12/14/2021 Left ?12/14/2021  ?Hip flexion 3+ 3  ?Hip extension 2 2  ?Hip abduction 2 2  ?Hip adduction 4 4  ?Hip internal rotation 4 4  ?Hip external rotation 2 2  ?Knee flexion 3 3  ?Knee extension 4 4  ?Ankle dorsiflexion 2 2  ?Ankle plantarflexion 3 3  ?Ankle inversion      ?Ankle eversion      ? (Blank rows = not tested) ?  ?FUNCTIONAL TESTS:  ?5 times sit to stand: 26.4 s use of hands ?2 minute walk test: 13406ft ?  ?GAIT: ?Distance walked: 106 ?Assistive device utilized: Environmental consultantWalker - 4 wheeled ?Level of assistance: Complete Independence ?Comments: gait deficits include forward flexed trunk, scissor gait, drop foot, genu recurvatum in    stance phase ?  [facility]                                                [facility]: 12/19/21 ?Therapeutic Exercise: ?Nustep L5 UE and LE  ?Sit to stand  ?Sit to Stand Without Arm Support  10 reps used red loop ?Heel Raises with walker  10 reps - 3 hold ?Hooklying Clamshell with Resistance 10 reps Rt LE red looped band  ? LTR x 10  ?Sidelying clam no band x 15 min ROM ?Bridging unable to do effectively with feet on the mat, needed LE elevation and knee extension , heavy use of UEs on the mat  ?Neuromuscular re-ed: ?Gait in parallel bars retro, FW and sidestepping  ?Self Care: ?HEP, stretching adductors, possible AFO, gait   ? ?PATIENT EDUCATION:  ?Education details: Eval findings, POC, HEP ?Person educated: Patient ?Education method: Explanation, Demonstration, Tactile cues, Verbal cues, and Handouts ?Education comprehension: verbalized understanding, returned demonstration, verbal cues required, tactile cues required, and needs further education ?  ?  ?HOME EXERCISE PROGRAM: ?Access Code: [email or web address] ?URL: [email or web address] ?Date: 12/14/2021 ?Prepared by: Joellyn RuedAllen Ralls ?  ?Exercises ?- Sit to Stand Without Arm Support  - 1 x daily - 7 x weekly - 3 sets - 10 reps - 3 hold ?- Heel Raises with Counter Support  - 1  x daily - 7 x weekly - 3 sets - 10 reps - 3 hold ?- Seated Heel Toe Raises  - 1 x daily - 7 x weekly - 3 sets - 10 reps - 2 hold ?- Hooklying Clamshell with Resistance  - 1 x daily - 7 x weekly - 3 sets - 10 reps - 3 hold ?  ?ASSESSMENT: ?  ?CLINICAL IMPRESSION: ?Pt able to work on mat level stretching and strengthening and progress to standing, gait without increased pain .  He was able to carry over cues for gait with less scissoring. He would benefit from AFO, consult with orthotist.   ? ? ?OBJECTIVE  IMPAIRMENTS Abnormal gait, decreased activity tolerance, decreased balance, difficulty walking, decreased ROM, decreased strength, impaired flexibility, and postural dysfunction.  ?  ?ACTIVITY LIMITATIONS cleaning, community activity, meal prep, yard work, shopping, and hobbies .  ?  ?PERSONAL FACTORS Past/current experiences, Time since onset of injury/illness/exacerbation, and 1-2 comorbidities:    arthritis and previous neck surgery around 2000 are also affecting patient's functional outcome. ?  ?REHAB POTENTIAL: Good ?  ?CLINICAL DECISION MAKING: Evolving/moderate complexity ?  ?EVALUATION COMPLEXITY: Moderate ?  ?  ?GOALS: ?  ?SHORT TERM GOALS: Target date: 12/28/2021 ?  ?Pt will be Ind in an initial HEP ?Baseline:started on eval ?Goal status: INITIAL ?  ?LONG TERM GOALS: Target date: 02/15/22 ?  ?Increase pt's 2 min walking test to 256ft or more as indication of improved function ?Baseline: 106 c RW ?Goal status: INITIAL ?  ?2.  Decreased pt's 5xSTS time to 20" or less s use of hands ?Baseline: 26.4 sec without use of hands ?Goal status: INITIAL ?  ?3.  Increase pt's hip strength to at least 3+/5 and knee strength to at least 4+/5 for improved function of the LEs     ?Baseline: See flow sheets ?Goal status: INITIAL ?  ?4.  Pt will demonstrate improve gait walking with a LRAD, improved gait pattern, and for an increased distance of 465ft ?Baseline: 106 ft c RW ?Goal status: INITIAL ?  ?5.  Pt will be Ind in a  final HEP to maintain achieved LOF ?Baseline: started on eval ?Goal status: INITIAL ?  ?PLAN: ?PT FREQUENCY: 2x/week ?  ?PT DURATION: 8 weeks ?  ?PLANNED INTERVENTIONS: Therapeutic exercises, Therapeutic activity, N

## 2021-12-19 ENCOUNTER — Encounter: Payer: Self-pay | Admitting: Physical Therapy

## 2021-12-19 ENCOUNTER — Ambulatory Visit: Payer: Medicare HMO | Admitting: Physical Therapy

## 2021-12-19 DIAGNOSIS — M25672 Stiffness of left ankle, not elsewhere classified: Secondary | ICD-10-CM

## 2021-12-19 DIAGNOSIS — M6281 Muscle weakness (generalized): Secondary | ICD-10-CM

## 2021-12-19 DIAGNOSIS — R262 Difficulty in walking, not elsewhere classified: Secondary | ICD-10-CM | POA: Diagnosis not present

## 2021-12-19 DIAGNOSIS — M25671 Stiffness of right ankle, not elsewhere classified: Secondary | ICD-10-CM

## 2021-12-21 ENCOUNTER — Encounter: Payer: Self-pay | Admitting: Physical Therapy

## 2021-12-21 ENCOUNTER — Ambulatory Visit: Payer: Medicare HMO | Admitting: Physical Therapy

## 2021-12-21 DIAGNOSIS — R262 Difficulty in walking, not elsewhere classified: Secondary | ICD-10-CM | POA: Diagnosis not present

## 2021-12-21 DIAGNOSIS — M6281 Muscle weakness (generalized): Secondary | ICD-10-CM

## 2021-12-21 DIAGNOSIS — M25671 Stiffness of right ankle, not elsewhere classified: Secondary | ICD-10-CM

## 2021-12-21 DIAGNOSIS — M25672 Stiffness of left ankle, not elsewhere classified: Secondary | ICD-10-CM

## 2021-12-21 NOTE — Therapy (Signed)
?OUTPATIENT PHYSICAL THERAPY TREATMENT NOTE ? ? ?Patient Name: Thomas Warner ?MRN: [id number] ?DOB:Mar 31, 1961, 61 y.o., male ?Today's Date: 12/21/2021 ? ?PCP: Jolinda Croak, MD ?REFERRING PROVIDER: Jolinda Croak, MD ? ?END OF SESSION:  ? PT End of Session - 12/21/21 0805   ? ? Visit Number 3   ? Number of Visits 17   ? Date for PT Re-Evaluation 02/15/22   ? Authorization Type HUMANA MEDICARE HMO; MEDICAID OF [place]   ? PT Start Time 0802   ? PT Stop Time [phone]   ? PT Time Calculation (min) 41 min   ? ?  ?  ? ?  ? ? ?Past Medical History:  ?Diagnosis Date  ? Arthritis   ? GERD (gastroesophageal reflux disease)   ? Hypertension   ? ?Past Surgical History:  ?Procedure Laterality Date  ? LUMBAR LAMINECTOMY/DECOMPRESSION MICRODISCECTOMY N/A 08/03/2021  ? Procedure: Thoracic Eleven-Twelve  Decompression;  Surgeon: Ashok Pall, MD;  Location: [place];  Service: Neurosurgery;  Laterality: N/A;  3C/RM 20  ? NECK SURGERY    ? approx 2000, plate and screws  ? ?Patient Active Problem List  ? Diagnosis Date Noted  ? Thoracic spinal stenosis 08/03/2021  ? ? ?REFERRING DIAG: spinal stenosis  ? ?THERAPY DIAG:  ?Difficulty in walking, not elsewhere classified ? ?Muscle weakness (generalized) ? ?Stiffness of left ankle, not elsewhere classified ? ?Stiffness of right ankle, not elsewhere classified ? ?PERTINENT HISTORY: Dr. Christella Noa performed a thoracic laminectomy on the patient on 08/03/2021. ?Cervical fusion 20 yrs ago ? ? ?PRECAUTIONS: spasticity, abnormal gait and LE weakness ? ?SUBJECTIVE: I am doing OK.  Pt reports no pain , just difficulty walking, often steps on his feet.  He has been doing the exercises . He cant stand up straight but not because of pain , it is the stiffness. Unable to lift Rt heel off the ground at all even in sitting.   ? ?PAIN:  ?Are you having pain? No ? ? ?DIAGNOSTIC FINDINGS:  ?  On: 06/23/2021 ?IMPRESSION: ?Cervical spine: ?  ?1. 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 ?2. Status post ACDF C3-C5. Congenitally short pedicles lead to at ?least mild spinal canal stenosis most cervical levels, including the ?fused levels. ?3. Multilevel facet and uncovertebral hypertrophy, worst at C6-C7, ?where it is severe bilaterally, and C5-C6, where it is moderate to ?severe bilaterally. ?  ?Thoracic spine: ?  ?1. T11-T12 severe spinal canal stenosis and severe bilateral neural ?foraminal narrowing. Increased T2 signal within the spinal cord at ?this level without apparent cord expansion, which may represent cord ?edema. ?2. Moderate to severe bilateral neural foraminal narrowing at T9-T10 ?and T10-T11. ?3. Moderate bilateral neural foraminal narrowing at T1-T2, T2-T3, ?and T3-T4. ?  ?Lumbar spine: ?  ?1. L2-L3 and L3-L4 moderate to severe thecal sac narrowing, ?secondary to severe disc height loss, disc osteophyte complexes, ?disc bulges, and epidural lipomatosis. L2-L3 moderate bilateral ?neural foraminal narrowing and L3-L4 moderate to severe bilateral ?neural foraminal narrowing. ?2. L4-L5 moderate thecal sac narrowing, due to the aforementioned ?factors, severe left and moderate to severe right neural foraminal ?narrowing. ?3. L5-S1 severe bilateral neural foraminal narrowing ?  ?  ?PATIENT SURVEYS:  ?FOTO 56% ?  ?SCREENING FOR RED FLAGS: ?Bowel or bladder incontinence: No ?Spinal tumors: No ?Cauda equina syndrome: No ?Compression fracture: No ?  ?COGNITION: ?          Overall cognitive status: Within functional limits for tasks assessed               ?           ?  SENSATION: ?Not tested Pt reports the sensation of walking on bubble wrap on the bottom of his feet.  This is increased after the exercises.  ?  ?MUSCLE LENGTH: ?Hamstrings: Right 35 deg; Left 38 deg ?Tight hip flexors, hip adductors, heel cords bilaterally ?  ?POSTURE:  ?Forward flexed trunk ?  ?PALPATION: ?NT ?  ?LE MMT: ?  ?MMT Right ?12/14/2021 Left ?12/14/2021  ?Hip flexion 3+ 3  ?Hip  extension 2 2  ?Hip abduction 2 2  ?Hip adduction 4 4  ?Hip internal rotation 4 4  ?Hip external rotation 2 2  ?Knee flexion 3 3  ?Knee extension 4 4  ?Ankle dorsiflexion 2 2  ?Ankle plantarflexion 3 3  ?Ankle inversion      ?Ankle eversion      ? (Blank rows = not tested) ?  ?FUNCTIONAL TESTS:  ?5 times sit to stand: 26.4 s use of hands ?2 minute walk test: 110ft ?  ?GAIT: ?Distance walked: 106 ?Assistive device utilized: Environmental consultant - 4 wheeled ?Level of assistance: Complete Independence ?Comments: gait deficits include forward flexed trunk, scissor gait, drop foot, genu recurvatum in    stance phase ?  ?[facility] Adult PT Treatment:                                                DATE: 12/21/21 ?Therapeutic Exercise: ?Nustep L5 UE and LE  ?Heel Raises in parallel bard  20 reps - 3 hold ?Standing  hip flexion x 10 each  ?Standing hip abduction x 10 each ?Sit-stand from standard chair- no UE  ?Seated clam yellow band 5 sec hold  ?Seated calf/hamstring, adductor stretching using yoga strap EOM ?Bridge x 6 with PTA assist to hold feet in place- able to complete 6 reps  ?LTR x 10  ?Hooklying Clamshell with Resistance 10 reps bilat yellow  ? ?Neuromuscular re-ed: ?Gait in parallel bars retro, FW and sidestepping  ?Self Care: ?Hope to have AFO rep at one of his visits next week  ? ?[facility] Adult PT Treatment:                                                DATE: 12/19/21 ?Therapeutic Exercise: ?Nustep L5 UE and LE  ?Sit to stand  ?Sit to Stand Without Arm Support  10 reps used red loop ?Heel Raises with walker  10 reps - 3 hold ?Hooklying Clamshell with Resistance 10 reps Rt LE red looped band  ? LTR x 10  ?Sidelying clam no band x 15 min ROM ?Bridging unable to do effectively with feet on the mat, needed LE elevation and knee extension , heavy use of UEs on the mat  ?Neuromuscular re-ed: ?Gait in parallel bars retro, FW and sidestepping  ?Self Care: ?HEP, stretching adductors, possible AFO, gait   ? ?PATIENT EDUCATION:  ?Education  details: Eval findings, POC, HEP ?Person educated: Patient ?Education method: Explanation, Demonstration, Tactile cues, Verbal cues, and Handouts ?Education comprehension: verbalized understanding, returned demonstration, verbal cues required, tactile cues required, and needs further education ?  ?  ?HOME EXERCISE PROGRAM: ?Access Code: [email or web address] ?URL: [email or web address] ?Date: 12/14/2021 ?Prepared by: Gar Ponto ?  ?Exercises ?- Sit to Stand Without Arm Support  - 1 x daily - 7  x weekly - 3 sets - 10 reps - 3 hold ?- Heel Raises with Counter Support  - 1 x daily - 7 x weekly - 3 sets - 10 reps - 3 hold ?- Seated Heel Toe Raises  - 1 x daily - 7 x weekly - 3 sets - 10 reps - 2 hold ?- Hooklying Clamshell with Resistance  - 1 x daily - 7 x weekly - 3 sets - 10 reps - 3 hold ?  ?ASSESSMENT: ?  ?CLINICAL IMPRESSION: ?Able to continue in parallel bars for gait and closed chain strengthening with heavy UE assist. Able to perform sit-stand repetitions from standard chair without UE. Attempted standing calf stretch. He did better seated EOM using yoga strap to stretch hamstrings, calves and adductors. Able to complete 6 standing bridge reps with PTA holding feet in place.  He would benefit from AFO, consult with orthotist rep hopeful for next week.   ? ? ?OBJECTIVE IMPAIRMENTS Abnormal gait, decreased activity tolerance, decreased balance, difficulty walking, decreased ROM, decreased strength, impaired flexibility, and postural dysfunction.  ?  ?ACTIVITY LIMITATIONS cleaning, community activity, meal prep, yard work, shopping, and hobbies .  ?  ?PERSONAL FACTORS Past/current experiences, Time since onset of injury/illness/exacerbation, and 1-2 comorbidities:    arthritis and previous neck surgery around 2000 are also affecting patient's functional outcome. ?  ?REHAB POTENTIAL: Good ?  ?CLINICAL DECISION MAKING: Evolving/moderate complexity ?  ?EVALUATION COMPLEXITY: Moderate ?  ?  ?GOALS: ?  ?SHORT TERM  GOALS: Target date: 12/28/2021 ?  ?Pt will be Ind in an initial HEP ?Baseline:started on eval ?Goal status: INITIAL ?  ?LONG TERM GOALS: Target date: 02/15/22 ?  ?Increase pt's 2 min walking test to 286ft or more as indication o

## 2021-12-25 ENCOUNTER — Ambulatory Visit: Payer: Medicare HMO

## 2021-12-25 DIAGNOSIS — M25672 Stiffness of left ankle, not elsewhere classified: Secondary | ICD-10-CM

## 2021-12-25 DIAGNOSIS — R262 Difficulty in walking, not elsewhere classified: Secondary | ICD-10-CM

## 2021-12-25 DIAGNOSIS — M6281 Muscle weakness (generalized): Secondary | ICD-10-CM

## 2021-12-25 DIAGNOSIS — M25671 Stiffness of right ankle, not elsewhere classified: Secondary | ICD-10-CM

## 2021-12-25 NOTE — Therapy (Signed)
?OUTPATIENT PHYSICAL THERAPY TREATMENT NOTE ? ? ?Patient Name: Thomas Warner ?MRN: [id number] ?DOB:07/11/1961, 62 y.o., male ?Today's Date: 12/25/2021 ? ?PCP: Stevphen Rochester, MD ?REFERRING PROVIDER: Stevphen Rochester, MD ? ?END OF SESSION:  ? PT End of Session - 12/25/21 0856   ? ? Visit Number 4   ? Number of Visits 17   ? Date for PT Re-Evaluation 02/15/22   ? Authorization Type HUMANA MEDICARE HMO; MEDICAID OF [place]   ? Authorization Time Period [employer] Approved 12 PT visits 12/14/2021-01/25/2022   ? Authorization - Visit Number 3   ? Authorization - Number of Visits 12   ? Progress Note Due on Visit 10   ? PT Start Time [phone]   ? PT Stop Time 0932   ? PT Time Calculation (min) 41 min   ? Equipment Utilized During Treatment Other (comment)   RW  ? Activity Tolerance Patient tolerated treatment well   ? Behavior During Therapy [facility] for tasks assessed/performed   ? ?  ?  ? ?  ? ? ? ?Past Medical History:  ?Diagnosis Date  ? Arthritis   ? GERD (gastroesophageal reflux disease)   ? Hypertension   ? ?Past Surgical History:  ?Procedure Laterality Date  ? LUMBAR LAMINECTOMY/DECOMPRESSION MICRODISCECTOMY N/A 08/03/2021  ? Procedure: Thoracic Eleven-Twelve  Decompression;  Surgeon: Coletta Memos, MD;  Location: [facility] OR;  Service: Neurosurgery;  Laterality: N/A;  3C/RM 20  ? NECK SURGERY    ? approx 2000, plate and screws  ? ?Patient Active Problem List  ? Diagnosis Date Noted  ? Thoracic spinal stenosis 08/03/2021  ? ? ?REFERRING DIAG: spinal stenosis  ? ?THERAPY DIAG:  ?Difficulty in walking, not elsewhere classified ? ?Muscle weakness (generalized) ? ?Stiffness of left ankle, not elsewhere classified ? ?Stiffness of right ankle, not elsewhere classified ? ?PERTINENT HISTORY: Dr. Franky Macho performed a thoracic laminectomy on the patient on 08/03/2021. ?Cervical fusion 20 yrs ago ? ? ?PRECAUTIONS: spasticity, abnormal gait and LE weakness ? ?SUBJECTIVE: pt reports he is doing well and offered no new concerns.   ? ?PAIN:  ?Are  you having pain? No ? ? ?DIAGNOSTIC FINDINGS:  ?  On: 06/23/2021 ?IMPRESSION: ?Cervical spine: ?  ?1. 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 ?2. Status post ACDF C3-C5. Congenitally short pedicles lead to at ?least mild spinal canal stenosis most cervical levels, including the ?fused levels. ?3. Multilevel facet and uncovertebral hypertrophy, worst at C6-C7, ?where it is severe bilaterally, and C5-C6, where it is moderate to ?severe bilaterally. ?  ?Thoracic spine: ?  ?1. T11-T12 severe spinal canal stenosis and severe bilateral neural ?foraminal narrowing. Increased T2 signal within the spinal cord at ?this level without apparent cord expansion, which may represent cord ?edema. ?2. Moderate to severe bilateral neural foraminal narrowing at T9-T10 ?and T10-T11. ?3. Moderate bilateral neural foraminal narrowing at T1-T2, T2-T3, ?and T3-T4. ?  ?Lumbar spine: ?  ?1. L2-L3 and L3-L4 moderate to severe thecal sac narrowing, ?secondary to severe disc height loss, disc osteophyte complexes, ?disc bulges, and epidural lipomatosis. L2-L3 moderate bilateral ?neural foraminal narrowing and L3-L4 moderate to severe bilateral ?neural foraminal narrowing. ?2. L4-L5 moderate thecal sac narrowing, due to the aforementioned ?factors, severe left and moderate to severe right neural foraminal ?narrowing. ?3. L5-S1 severe bilateral neural foraminal narrowing ?  ?  ?PATIENT SURVEYS:  ?FOTO 56% ?  ?SCREENING FOR RED FLAGS: ?Bowel or bladder incontinence: No ?Spinal tumors: No ?Cauda equina syndrome: No ?Compression  fracture: No ?  ?COGNITION: ?          Overall cognitive status: Within functional limits for tasks assessed               ?           ?SENSATION: ?Not tested Pt reports the sensation of walking on bubble wrap on the bottom of his feet.  This is increased after the exercises.  ?  ?MUSCLE LENGTH: ?Hamstrings: Right 35 deg; Left 38 deg ?Tight hip flexors, hip adductors, heel  cords bilaterally ?  ?POSTURE:  ?Forward flexed trunk ?  ?PALPATION: ?NT ?  ?LE MMT: ?  ?MMT Right ?12/14/2021 Left ?12/14/2021  ?Hip flexion 3+ 3  ?Hip extension 2 2  ?Hip abduction 2 2  ?Hip adduction 4 4  ?Hip internal rotation 4 4  ?Hip external rotation 2 2  ?Knee flexion 3 3  ?Knee extension 4 4  ?Ankle dorsiflexion 2 2  ?Ankle plantarflexion 3 3  ?Ankle inversion      ?Ankle eversion      ? (Blank rows = not tested) ?  ?FUNCTIONAL TESTS:  ?5 times sit to stand: 26.4 s use of hands ?2 minute walk test: 180ft ?  ?GAIT: ?Distance walked: 106 ?Assistive device utilized: Environmental consultant - 4 wheeled ?Level of assistance: Complete Independence ?Comments: gait deficits include forward flexed trunk, scissor gait, drop foot, genu recurvatum in    stance phase ? ?[facility] Adult PT Treatment:                                                DATE: 12/25/21 ?Therapeutic Exercise: ?Nustep L7 UE and LE 5 mins ?Standing gastroc stretch SL and DL 30" ?Heel Raises at [facility] 20 reps - 3 hold ?Hip add/IR stretch, SL fall outs ?HL Hip clams 3x10, RTB  ?Standing hip abduction x 10 each ?Sit-stand from standard chair- no UE, 2x10 ?Side stepping 3x12 ?Standing Hip abd 3x10 ? ?[facility] Adult PT Treatment:                                                DATE: 12/21/21 ?Therapeutic Exercise: ?Nustep L5 UE and LE  ?Heel Raises in parallel bard  20 reps - 3 hold ?Standing  hip flexion x 10 each  ?Standing hip abduction x 10 each ?Sit-stand from standard chair- no UE  ?Seated clam yellow band 5 sec hold  ?Seated calf/hamstring, adductor stretching using yoga strap EOM ?Bridge x 6 with PTA assist to hold feet in place- able to complete 6 reps  ?LTR x 10  ?Hooklying Clamshell with Resistance 10 reps bilat yellow  ? ?Neuromuscular re-ed: ?Gait in parallel bars retro, FW and sidestepping  ?Self Care: ?Hope to have AFO rep at one of his visits next week  ? ?[facility] Adult PT Treatment:                                                DATE: 12/19/21 ?Therapeutic Exercise: ?Nustep  L5 UE and LE  ?Sit to stand  ?Sit to Stand Without Arm Support  10  reps used red loop ?Heel Raises with walker  10 reps - 3 hold ?Hooklying Clamshell with Resistance 10 reps Rt LE red looped band  ? LTR x 10  ?Sidelying clam no band x 15 min ROM ?Bridging unable to do effectively with feet on the mat, needed LE elevation and knee extension , heavy use of UEs on the mat  ?Neuromuscular re-ed: ?Gait in parallel bars retro, FW and sidestepping  ?Self Care: ?HEP, stretching adductors, possible AFO, gait   ? ?PATIENT EDUCATION:  ?Education details: Eval findings, POC, HEP ?Person educated: Patient ?Education method: Explanation, Demonstration, Tactile cues, Verbal cues, and Handouts ?Education comprehension: verbalized understanding, returned demonstration, verbal cues required, tactile cues required, and needs further education ?  ?  ?HOME EXERCISE PROGRAM: ?Access Code: [email or web address] ?URL: [email or web address] ?Date: 12/25/2021 ?Prepared by: Joellyn RuedAllen Terrea Bruster ? ?Exercises ?- Sit to Stand Without Arm Support  - 1 x daily - 7 x weekly - 3 sets - 10 reps - 3 hold ?- Heel Raises with Counter Support  - 1 x daily - 7 x weekly - 3 sets - 10 reps - 3 hold ?- Seated Heel Toe Raises  - 1 x daily - 7 x weekly - 3 sets - 10 reps - 2 hold ?- Hooklying Clamshell with Resistance  - 1 x daily - 7 x weekly - 3 sets - 10 reps - 3 hold ?- Standing Gastroc Stretch at [employer]  - 1 x daily - 7 x weekly - 1 sets - 3 reps - 30 hold ?- Supine Butterfly Groin Stretch  - 1 x daily - 7 x weekly - 3 sets - 3 reps - 30 hold ?  ?ASSESSMENT: ?  ?CLINICAL IMPRESSION: ?PT was completed for LE flexibility for the ankle PFs and the hip adds and IRs and strengthening of the LEs to address areas of increased muscle tightness and weakness. These deficits are negatively impacting on the pt's gait pattern. The stretches were added to the pt's HEP. Pt tolerated today's PT session without adverse effects.  ? ?OBJECTIVE IMPAIRMENTS Abnormal gait, decreased  activity tolerance, decreased balance, difficulty walking, decreased ROM, decreased strength, impaired flexibility, and postural dysfunction.  ?  ?ACTIVITY LIMITATIONS cleaning, community activity, mea

## 2021-12-26 NOTE — Therapy (Signed)
?OUTPATIENT PHYSICAL THERAPY TREATMENT NOTE ? ? ?Patient Name: Thomas Warner ?MRN: [id number] ?DOB:07/08/1961, 61 y.o., male ?Today's Date: 12/27/2021 ? ?PCP: Stevphen Rochester, MD ?REFERRING PROVIDER: Coletta Memos, MD ? ?END OF SESSION:  ? PT End of Session - 12/27/21 1654   ? ? Visit Number 5   ? Number of Visits 17   ? Date for PT Re-Evaluation 02/15/22   ? Authorization Type HU[employer] MEDICARE HMO; MEDICAID OF [place]   ? Authorization Time Period [employer] Approved 12 PT visits 12/14/2021-01/25/2022   ? Authorization - Visit Number 4   ? Authorization - Number of Visits 12   ? Progress Note Due on Visit 10   ? PT Start Time [id number]   ? PT Stop Time 1025   ? PT Time Calculation (min) 48 min   ? Equipment Utilized During Treatment Other (comment)   RW  ? Activity Tolerance Patient tolerated treatment well   ? Behavior During Therapy [facility] for tasks assessed/performed   ? ?  ?  ? ?  ? ? ? ? ?Past Medical History:  ?Diagnosis Date  ? Arthritis   ? GERD (gastroesophageal reflux disease)   ? Hypertension   ? ?Past Surgical History:  ?Procedure Laterality Date  ? LUMBAR LAMINECTOMY/DECOMPRESSION MICRODISCECTOMY N/A 08/03/2021  ? Procedure: Thoracic Eleven-Twelve  Decompression;  Surgeon: Coletta Memos, MD;  Location: [facility] OR;  Service: Neurosurgery;  Laterality: N/A;  3C/RM 20  ? NECK SURGERY    ? approx 2000, plate and screws  ? ?Patient Active Problem List  ? Diagnosis Date Noted  ? Thoracic spinal stenosis 08/03/2021  ? ? ?REFERRING DIAG: spinal stenosis  ? ?THERAPY DIAG:  ?Difficulty in walking, not elsewhere classified ? ?Muscle weakness (generalized) ? ?Stiffness of left ankle, not elsewhere classified ? ?Stiffness of right ankle, not elsewhere classified ? ?PERTINENT HISTORY: Dr. Franky Macho performed a thoracic laminectomy on the patient on 08/03/2021. ?Cervical fusion 20 yrs ago ? ? ?PRECAUTIONS: spasticity, abnormal gait and LE weakness ? ?SUBJECTIVE: Pt reports experiencing LE and back soreness yesterday after the last PT  session. He notes being les sore today ? ?PAIN:  ?Are you having pain? No ? ? ?DIAGNOSTIC FINDINGS:  ?  On: 06/23/2021 ?IMPRESSION: ?Cervical spine: ?  ?1. 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 ?2. Status post ACDF C3-C5. Congenitally short pedicles lead to at ?least mild spinal canal stenosis most cervical levels, including the ?fused levels. ?3. Multilevel facet and uncovertebral hypertrophy, worst at C6-C7, ?where it is severe bilaterally, and C5-C6, where it is moderate to ?severe bilaterally. ?  ?Thoracic spine: ?  ?1. T11-T12 severe spinal canal stenosis and severe bilateral neural ?foraminal narrowing. Increased T2 signal within the spinal cord at ?this level without apparent cord expansion, which may represent cord ?edema. ?2. Moderate to severe bilateral neural foraminal narrowing at T9-T10 ?and T10-T11. ?3. Moderate bilateral neural foraminal narrowing at T1-T2, T2-T3, ?and T3-T4. ?  ?Lumbar spine: ?  ?1. L2-L3 and L3-L4 moderate to severe thecal sac narrowing, ?secondary to severe disc height loss, disc osteophyte complexes, ?disc bulges, and epidural lipomatosis. L2-L3 moderate bilateral ?neural foraminal narrowing and L3-L4 moderate to severe bilateral ?neural foraminal narrowing. ?2. L4-L5 moderate thecal sac narrowing, due to the aforementioned ?factors, severe left and moderate to severe right neural foraminal ?narrowing. ?3. L5-S1 severe bilateral neural foraminal narrowing ?  ?  ?PATIENT SURVEYS:  ?FOTO 56% ?  ?SCREENING FOR RED FLAGS: ?Bowel or bladder incontinence: No ?Spinal tumors:  No ?Cauda equina syndrome: No ?Compression fracture: No ?  ?COGNITION: ?          Overall cognitive status: Within functional limits for tasks assessed               ?           ?SENSATION: ?Not tested Pt reports the sensation of walking on bubble wrap on the bottom of his feet.  This is increased after the exercises.  ?  ?MUSCLE LENGTH: ?Hamstrings: Right 35  deg; Left 38 deg ?Tight hip flexors, hip adductors, heel cords bilaterally ?  ?POSTURE:  ?Forward flexed trunk ?  ?PALPATION: ?NT ?  ?LE MMT: ?  ?MMT Right ?12/14/2021 Left ?12/14/2021  ?Hip flexion 3+ 3  ?Hip extension 2 2  ?Hip abduction 2 2  ?Hip adduction 4 4  ?Hip internal rotation 4 4  ?Hip external rotation 2 2  ?Knee flexion 3 3  ?Knee extension 4 4  ?Ankle dorsiflexion 2 2  ?Ankle plantarflexion 3 3  ?Ankle inversion      ?Ankle eversion      ? (Blank rows = not tested) ?  ?FUNCTIONAL TESTS:  ?5 times sit to stand: 26.4 s use of hands ?2 minute walk test: 16406ft ?  ?GAIT: ?Distance walked: 106 ?Assistive device utilized: Environmental consultantWalker - 4 wheeled ?Level of assistance: Complete Independence ?Comments: gait deficits include forward flexed trunk, scissor gait, drop foot, genu recurvatum in    stance phase ? [facility]                                                [facility]: 12/27/21 ?Therapeutic Exercise: ?Seated banded hip abd, hip flexion 2x10 RTB ?Sit t/f standing, bari mat table, 2x10 no hands ?Standing balance feet together with 8# shift from hand to hand ?Standing gastroc stretch SL 30" ? ?Therapeutic Activity: ?Gait training with AFOs ? ?[facility]                                                [facility]: 12/25/21 ?Therapeutic Exercise: ?Nustep L7 UE and LE 5 mins ?Standing gastroc stretch SL and DL 30" ?[facility] reps - 3 hold ?Hip add/IR stretch, SL fall outs ?HL Hip clams 3x10, RTB  ?Standing hip abduction x 10 each ?Sit-stand from standard chair- no UE, 2x10 ?Side stepping 3x12 ?Standing Hip abd 3x10 ? ?[facility]                                                DATE: 12/21/21 ?Therapeutic Exercise: ?Nustep L5 UE and LE  ?Heel Raises in parallel bard  20 reps - 3 hold ?Standing  hip flexion x 10 each  ?Standing hip abduction x 10 each ?Sit-stand from standard chair- no UE  ?Seated clam yellow band 5 sec hold  ?Seated calf/hamstring, adductor stretching using yoga strap  EOM ?Bridge x 6 with PTA assist to hold feet in place- able to complete 6 reps  ?LTR x 10  ?Hooklying Clamshell with Resistance 10 reps bilat yellow  ? ?  Neuromuscular re-ed: ?Gait in parallel bars retro, FW and sidestepping  ?Self Care: ?Hope to have AFO rep at one of his visits next week  ? ?[facility] Adult PT Treatment:                                                DATE: 12/19/21 ?Therapeutic Exercise: ?Nustep L5 UE and LE  ?Sit to stand  ?Sit to Stand Without Arm Support  10 reps used red loop ?Heel Raises with walker  10 reps - 3 hold ?Hooklying Clamshell with Resistance 10 reps Rt LE red looped band  ? LTR x 10  ?Sidelying clam no band x 15 min ROM ?Bridging unable to do effectively with feet on the mat, needed LE elevation and knee extension , heavy use of UEs on the mat  ?Neuromuscular re-ed: ?Gait in parallel bars retro, FW and sidestepping  ?Self Care: ?HEP, stretching adductors, possible AFO, gait   ? ?PATIENT EDUCATION:  ?Education details: Eval findings, POC, HEP ?Person educated: Patient ?Education method: Explanation, Demonstration, Tactile cues, Verbal cues, and Handouts ?Education comprehension: verbalized understanding, returned demonstration, verbal cues required, tactile cues required, and needs further education ?  ?  ?HOME EXERCISE PROGRAM: ?Access Code: [email or web address] ?URL: [email or web address] ?Date: 12/25/2021 ?Prepared by: Joellyn Rued ? ?Exercises ?- Sit to Stand Without Arm Support  - 1 x daily - 7 x weekly - 3 sets - 10 reps - 3 hold ?- Heel Raises with Counter Support  - 1 x daily - 7 x weekly - 3 sets - 10 reps - 3 hold ?- Seated Heel Toe Raises  - 1 x daily - 7 x weekly - 3 sets - 10 reps - 2 hold ?- Hooklying Clamshell with Resistance  - 1 x daily - 7 x weekly - 3 sets - 10 reps - 3 hold ?- Standing Gastroc Stretch at [employer]  - 1 x daily - 7 x weekly - 1 sets - 3 reps - 30 hold ?- Supine Butterfly Groin Stretch  - 1 x daily - 7 x weekly - 3 sets - 3 reps - 30 hold ?   ?ASSESSMENT: ?  ?CLINICAL IMPRESSION: ?PT was continued for LE strengthening and balance. Gait training and trial with AFOs was completed. The fit of the AFOs with the pt's shoes limited the DF benefit. Scissoring of the p

## 2021-12-27 ENCOUNTER — Ambulatory Visit: Payer: Medicare HMO

## 2021-12-27 DIAGNOSIS — M25671 Stiffness of right ankle, not elsewhere classified: Secondary | ICD-10-CM

## 2021-12-27 DIAGNOSIS — R262 Difficulty in walking, not elsewhere classified: Secondary | ICD-10-CM

## 2021-12-27 DIAGNOSIS — M6281 Muscle weakness (generalized): Secondary | ICD-10-CM

## 2021-12-27 DIAGNOSIS — M25672 Stiffness of left ankle, not elsewhere classified: Secondary | ICD-10-CM

## 2022-01-01 ENCOUNTER — Ambulatory Visit: Payer: Medicare HMO | Attending: Neurosurgery

## 2022-01-01 DIAGNOSIS — M25672 Stiffness of left ankle, not elsewhere classified: Secondary | ICD-10-CM | POA: Diagnosis present

## 2022-01-01 DIAGNOSIS — M6281 Muscle weakness (generalized): Secondary | ICD-10-CM | POA: Insufficient documentation

## 2022-01-01 DIAGNOSIS — R262 Difficulty in walking, not elsewhere classified: Secondary | ICD-10-CM | POA: Diagnosis present

## 2022-01-01 DIAGNOSIS — M25671 Stiffness of right ankle, not elsewhere classified: Secondary | ICD-10-CM | POA: Diagnosis present

## 2022-01-01 NOTE — Therapy (Signed)
?OUTPATIENT PHYSICAL THERAPY TREATMENT NOTE ? ? ?Patient Name: Thomas Warner ?MRN: [id number] ?DOB:1960/11/21, 61 y.o., male ?Today's Date: 01/02/2022 ? ?PCP: Stevphen Rochester, MD ?REFERRING PROVIDER: Stevphen Rochester, MD ? ?END OF SESSION:  ? PT End of Session - 01/01/22 9935   ? ? Visit Number 6   ? Number of Visits 17   ? Date for PT Re-Evaluation 02/15/22   ? Authorization Type HUMANA MEDICARE HMO; MEDICAID OF [place]   ? Authorization - Visit Number 5   ? Authorization - Number of Visits 12   ? Progress Note Due on Visit 10   ? PT Start Time [phone]   ? PT Stop Time 0929   ? PT Time Calculation (min) 43 min   ? Equipment Utilized During Treatment Other (comment)   RW  ? Activity Tolerance Patient tolerated treatment well   ? Behavior During Therapy [facility] for tasks assessed/performed   ? ?  ?  ? ?  ? ? ? ? ? ?Past Medical History:  ?Diagnosis Date  ? Arthritis   ? GERD (gastroesophageal reflux disease)   ? Hypertension   ? ?Past Surgical History:  ?Procedure Laterality Date  ? LUMBAR LAMINECTOMY/DECOMPRESSION MICRODISCECTOMY N/A 08/03/2021  ? Procedure: Thoracic Eleven-Twelve  Decompression;  Surgeon: Coletta Memos, MD;  Location: [facility] OR;  Service: Neurosurgery;  Laterality: N/A;  3C/RM 20  ? NECK SURGERY    ? approx 2000, plate and screws  ? ?Patient Active Problem List  ? Diagnosis Date Noted  ? Thoracic spinal stenosis 08/03/2021  ? ? ?REFERRING DIAG: spinal stenosis  ? ?THERAPY DIAG:  ?Difficulty in walking, not elsewhere classified ? ?Muscle weakness (generalized) ? ?Stiffness of left ankle, not elsewhere classified ? ?Stiffness of right ankle, not elsewhere classified ? ?PERTINENT HISTORY: Dr. Franky Macho performed a thoracic laminectomy on the patient on 08/03/2021. ?Cervical fusion 20 yrs ago ? ? ?PRECAUTIONS: spasticity, abnormal gait and LE weakness ? ?SUBJECTIVE: pt offered no  ? ?PAIN:  ?Are you having pain? No ? ? ?DIAGNOSTIC FINDINGS:  ?  On: 06/23/2021 ?IMPRESSION: ?Cervical spine: ?  ?1. Focal increased T2  signal and decreased cord caliber at C4-C5, ?likely chronic myelomalacia. ?2. Status post ACDF C3-C5. Congenitally short pedicles lead to at ?least mild spinal canal stenosis most cervical levels, including the ?fused levels. ?3. Multilevel facet and uncovertebral hypertrophy, worst at C6-C7, ?where it is severe bilaterally, and C5-C6, where it is moderate to ?severe bilaterally. ?  ?Thoracic spine: ?  ?1. T11-T12 severe spinal canal stenosis and severe bilateral neural ?foraminal narrowing. Increased T2 signal within the spinal cord at ?this level without apparent cord expansion, which may represent cord ?edema. ?2. Moderate to severe bilateral neural foraminal narrowing at T9-T10 ?and T10-T11. ?3. Moderate bilateral neural foraminal narrowing at T1-T2, T2-T3, ?and T3-T4. ?  ?Lumbar spine: ?  ?1. L2-L3 and L3-L4 moderate to severe thecal sac narrowing, ?secondary to severe disc height loss, disc osteophyte complexes, ?disc bulges, and epidural lipomatosis. L2-L3 moderate bilateral ?neural foraminal narrowing and L3-L4 moderate to severe bilateral ?neural foraminal narrowing. ?2. L4-L5 moderate thecal sac narrowing, due to the aforementioned ?factors, severe left and moderate to severe right neural foraminal ?narrowing. ?3. L5-S1 severe bilateral neural foraminal narrowing ?  ?  ?PATIENT SURVEYS:  ?FOTO 56% ?  ?SCREENING FOR RED FLAGS: ?Bowel or bladder incontinence: No ?Spinal tumors: No ?Cauda equina syndrome: No ?Compression fracture: No ?  ?COGNITION: ?          Overall cognitive status: Within  functional limits for tasks assessed               ?           ?SENSATION: ?Not tested Pt reports the sensation of walking on bubble wrap on the bottom of his feet.  This is increased after the exercises.  ?  ?MUSCLE LENGTH: ?Hamstrings: Right 35 deg; Left 38 deg ?Tight hip flexors, hip adductors, heel cords bilaterally ?  ?POSTURE:  ?Forward flexed trunk ?  ?PALPATION: ?NT ?  ?LE MMT: ?  ?MMT Right ?12/14/2021  Left ?12/14/2021  ?Hip flexion 3+ 3  ?Hip extension 2 2  ?Hip abduction 2 2  ?Hip adduction 4 4  ?Hip internal rotation 4 4  ?Hip external rotation 2 2  ?Knee flexion 3 3  ?Knee extension 4 4  ?Ankle dorsiflexion 2 2  ?Ankle plantarflexion 3 3  ?Ankle inversion      ?Ankle eversion      ? (Blank rows = not tested) ?  ?FUNCTIONAL TESTS:  ?5 times sit to stand: 26.4 s use of hands ?2 minute walk test: 11006ft ?  ?GAIT: ?Distance walked: 106 ?Assistive device utilized: Environmental consultantWalker - 4 wheeled ?Level of assistance: Complete Independence ?Comments: gait deficits include forward flexed trunk, scissor gait, drop foot, genu recurvatum in    stance phase ? ?[facility]                                                DATE: 01/01/22 ?Therapeutic Exercise: ?Seated figure 4 stretch 3x 20 sec each ?Seated hip abd c RTB 2x10 each ?Supine clams c GTB 2x10 ?Bridging 2x10 ?Standing c counter assist hip abd 3x10 each ?Sit-stand from standard chair- no UE, 2x10 ?Updated HEP ? ?Therapeutic Activity: ?Gait training with AFOs using a RW ? ?[facility]                                                DATE: 12/27/21 ?Therapeutic Exercise: ?Seated banded hip abd, hip flexion 2x10 RTB ?Sit t/f standing, bari mat table, 2x10 no hands ?Standing balance feet together with 8# shift from hand to hand ?Standing gastroc stretch SL 30" ? ?Therapeutic Activity: ?Gait training with AFOs ? ?[facility]                                                DATE: 12/25/21 ?Therapeutic Exercise: ?Nustep L7 UE and LE 5 mins ?Standing gastroc stretch SL and DL 30" ?Heel Raises [facility] reps - 3 hold ?Hip add/IR stretch, SL fall outs ?HL Hip clams 3x10, RTB  ?Standing hip abduction x 10 each ?Sit-stand from standard chair- no UE, 2x10 ?Side stepping 3x12 ?Standing Hip abd 3x10 ? ? ?PATIENT EDUCATION:  ?Education details: Eval findings, POC, HEP ?Person educated: Patient ?Education method: Explanation, Demonstration, Tactile cues, Verbal cues,  and Handouts ?Education comprehension: verbalized understanding, returned demonstration, verbal cues required, tactile cues required, and needs further education ?  ?  ?HOME EXERCISE PROGRAM: ?Access Code: [email or web address] ?URL: [email or web address] ?Date: 01/02/2022 ?Prepared by: Joellyn RuedAllen Carrell Rahmani ? ?  Exercises ?- Sit to Stand Without Arm Support  - 1 x daily - 7 x weekly - 3 sets - 10 reps - 3 hold ?- Heel Raises with Counter Support  - 1 x daily - 7 x weekly - 3 sets - 10 reps - 3 hold ?- Seated Heel Toe Raises  - 1 x daily - 7 x weekly - 3 sets - 10 reps - 2 hold ?- Hooklying Clamshell with Resistance  - 1 x daily - 7 x weekly - 3 sets - 10 reps - 3 hold ?- Standing Gastroc Stretch at [employer]  - 1 x daily - 7 x weekly - 1 sets - 3 reps - 30 hold ?- Seated Piriformis Stretch with Trunk Bend  - 1 x daily - 7 x weekly - 1 sets - 3-5 reps - 30 hold ?- Standing Hip Abduction with Counter Support  - 1 x daily - 7 x weekly - 3 sets - 10 reps - 3 hold ?  ?ASSESSMENT: ?  ?CLINICAL IMPRESSION: ?Gait training for the effectiveness was complete for walking c a RW. The pt wore a different pair of shoes which was a better fit with the AFO. Wth the AFO, increased ankle DF bilat occurred which decreased the frequency of the toes catching. Scissoring of the LEs is still an issue, but overall gait pattern was improved. Therex was also completed for flexibility and strengthening with emphasis on stretching the hip adductors and IRs and strengthening the abductores and ERs to address scissoring gait pattern. The AFOs were beneficial. Pt tolerated the sesson without adverse effects. ? ?OBJECTIVE IMPAIRMENTS Abnormal gait, decreased activity tolerance, decreased balance, difficulty walking, decreased ROM, decreased strength, impaired flexibility, and postural dysfunction.  ?  ?ACTIVITY LIMITATIONS cleaning, community activity, meal prep, yard work, shopping, and hobbies .  ?  ?PERSONAL FACTORS Past/current experiences, Time since  onset of injury/illness/exacerbation, and 1-2 comorbidities:    arthritis and previous neck surgery around 2000 are also affecting patient's functional outcome. ?  ?REHAB POTENTIAL: Good ?  ?CLINICAL DECISION MAKING:

## 2022-01-03 ENCOUNTER — Ambulatory Visit: Payer: Medicare HMO

## 2022-01-03 DIAGNOSIS — R262 Difficulty in walking, not elsewhere classified: Secondary | ICD-10-CM | POA: Diagnosis not present

## 2022-01-03 DIAGNOSIS — M6281 Muscle weakness (generalized): Secondary | ICD-10-CM

## 2022-01-03 DIAGNOSIS — M25672 Stiffness of left ankle, not elsewhere classified: Secondary | ICD-10-CM

## 2022-01-03 DIAGNOSIS — M25671 Stiffness of right ankle, not elsewhere classified: Secondary | ICD-10-CM

## 2022-01-03 NOTE — Therapy (Addendum)
?OUTPATIENT PHYSICAL THERAPY TREATMENT NOTE ? ? ?Patient Name: Thomas Warner ?MRN: [id number] ?DOB:08-28-1961, 61 y.o., male ?Today's Date: 01/04/2022 ? ?PCP: Thomas RochesterManfredi, Brenda Warner, Warner ?REFERRING PROVIDER: Coletta Memosabbell, Kyle, Warner ? ?END OF SESSION:  ? PT End of Session - 01/04/22 0544   ? ? Visit Number 7   ? Number of Visits 17   ? Date for PT Re-Evaluation 02/15/22   ? Authorization Type [employer] MEDICARE HMO; MEDICAID OF [place]   ? Authorization Time Period [employer] Approved 12 PT visits 12/14/2021-01/25/2022   ? Authorization - Visit Number 6   ? Authorization - Number of Visits 12   ? Progress Note Due on Visit 10   ? PT Start Time [phone]   ? PT Stop Time 0931   ? PT Time Calculation (min) 39 min   ? Equipment Utilized During Treatment Other (comment)   RW  ? Activity Tolerance Patient tolerated treatment well   ? [facility] tasks assessed/performed   ? ?  ?  ? ?  ? ? ? ? ? ? ?Past Medical History:  ?Diagnosis Date  ? Arthritis   ? GERD (gastroesophageal reflux disease)   ? Hypertension   ? ?Past Surgical History:  ?Procedure Laterality Date  ? LUMBAR LAMINECTOMY/DECOMPRESSION MICRODISCECTOMY N/A 08/03/2021  ? Procedure: Thoracic Eleven-Twelve  Decompression;  Surgeon: Thomas Warner;  [facility];  Service: Neurosurgery;  Laterality: N/A;  3C/RM 20  ? NECK SURGERY    ? approx 2000, plate and screws  ? ?Patient Active Problem List  ? Diagnosis Date Noted  ? Thoracic spinal stenosis 08/03/2021  ? ? ?REFERRING DIAG: spinal stenosis  ? ?THERAPY DIAG:  ?Difficulty in walking, not elsewhere classified ? ?Muscle weakness (generalized) ? ?Stiffness of left ankle, not elsewhere classified ? ?Stiffness of right ankle, not elsewhere classified ? ?PERTINENT HISTORY: Dr. Franky Warner performed a thoracic laminectomy on the patient on 08/03/2021. ?Cervical fusion 20 yrs ago ? ? ?PRECAUTIONS: spasticity, abnormal gait and LE weakness ? ?SUBJECTIVE: Pt offered no concerns. ? ?PAIN:  ?Are you having pain? No ? ? ?DIAGNOSTIC  FINDINGS:  ?  On: 06/23/2021 ?IMPRESSION: ?Cervical spine: ?  ?1. 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 ?2. Status post ACDF C3-C5. Congenitally short pedicles lead to at ?least mild spinal canal stenosis most cervical levels, including the ?fused levels. ?3. Multilevel facet and uncovertebral hypertrophy, worst at C6-C7, ?where it is severe bilaterally, and C5-C6, where it is moderate to ?severe bilaterally. ?  ?Thoracic spine: ?  ?1. T11-T12 severe spinal canal stenosis and severe bilateral neural ?foraminal narrowing. Increased T2 signal within the spinal cord at ?this level without apparent cord expansion, which may represent cord ?edema. ?2. Moderate to severe bilateral neural foraminal narrowing at T9-T10 ?and T10-T11. ?3. Moderate bilateral neural foraminal narrowing at T1-T2, T2-T3, ?and T3-T4. ?  ?Lumbar spine: ?  ?1. L2-L3 and L3-L4 moderate to severe thecal sac narrowing, ?secondary to severe disc height loss, disc osteophyte complexes, ?disc bulges, and epidural lipomatosis. L2-L3 moderate bilateral ?neural foraminal narrowing and L3-L4 moderate to severe bilateral ?neural foraminal narrowing. ?2. L4-L5 moderate thecal sac narrowing, due to the aforementioned ?factors, severe left and moderate to severe right neural foraminal ?narrowing. ?3. L5-S1 severe bilateral neural foraminal narrowing ?  ?  ?PATIENT SURVEYS:  ?FOTO 56% ?  ?SCREENING FOR RED FLAGS: ?Bowel or bladder incontinence: No ?Spinal tumors: No ?Cauda equina syndrome: No ?Compression fracture: No ?  ?COGNITION: ?  Overall cognitive status: Within functional limits for tasks assessed               ?           ?SENSATION: ?Not tested Pt reports the sensation of walking on bubble wrap on the bottom of his feet.  This is increased after the exercises.  ?  ?MUSCLE LENGTH: ?Hamstrings: Right 35 deg; Left 38 deg ?Tight hip flexors, hip adductors, heel cords bilaterally ?  ?POSTURE:   ?Forward flexed trunk ?  ?PALPATION: ?NT ?  ?LE MMT: ?  ?MMT Right ?12/14/2021 Left ?12/14/2021  ?Hip flexion 3+ 3  ?Hip extension 2 2  ?Hip abduction 2 2  ?Hip adduction 4 4  ?Hip internal rotation 4 4  ?Hip external rotation 2 2  ?Knee flexion 3 3  ?Knee extension 4 4  ?Ankle dorsiflexion 2 2  ?Ankle plantarflexion 3 3  ?Ankle inversion      ?Ankle eversion      ? (Blank rows = not tested) ?  ?FUNCTIONAL TESTS:  ?5 times sit to stand: 26.4 s use of hands ?2 minute walk test: 130ft ?  ?GAIT: ?Distance walked: 106 ?Assistive device utilized: Environmental consultant - 4 wheeled ?Level of assistance: Complete Independence ?Comments: gait deficits include forward flexed trunk, scissor gait, drop foot, genu recurvatum in    stance phase ? ?[facility] Adult PT Treatment:                                                DATE: 01/03/22 ?Therapeutic Exercise: ?Nustep 5 mins L6 UE/LE ?Seated frog leg stretch 3x 30" ?Seated hip ER GTB 2x15 ?Seated hip abd GTB 2x15 ?Seated ankle DF 2x15 ?STS bari mat table 2x10 ?Side stepping at counter, wide steps, x16 43ft ?Side stepping at counter c GTB x8 75ft ?Standing hip ext RTB 3x10 ?Manual Therapy: ?C/R frog leg ( hip add/IR) stretch x5 1'  ? ? ?[facility] Adult PT Treatment:                                                DATE: 01/01/22 ?Therapeutic Exercise: ?Seated figure 4 stretch 3x 20 sec each ?Seated hip abd c RTB 2x10 each ?Supine clams c GTB 2x10 ?Bridging 2x10 ?Standing c counter assist hip abd 3x10 each ?Sit-stand from standard chair- no UE, 2x10 ?Updated HEP ? ?Therapeutic Activity: ?Gait training with AFOs using a RW ? ?[facility] Adult PT Treatment:                                                DATE: 12/27/21 ?Therapeutic Exercise: ?Seated banded hip abd, hip flexion 2x10 RTB ?Sit t/f standing, bari mat table, 2x10 no hands ?Standing balance feet together with 8# shift from hand to hand ?Standing gastroc stretch SL 30" ? ?Therapeutic Activity: ?Gait training with AFOs ? ? ?PATIENT EDUCATION:  ?Education details:  Eval findings, POC, HEP ?Person educated: Patient ?Education method: Explanation, Demonstration, Tactile cues, Verbal cues, and Handouts ?Education comprehension: verbalized understanding, returned demonstration, verbal cues required, tactile cues required, and needs further education ?  ?  ?HOME EXERCISE  PROGRAM: ?Access Code: [email or web address] ?URL: [email or web address] ?Date: 01/02/2022 ?Prepared by: Joellyn Rued ? ?Exercises ?- Sit to Stand Without Arm Support  - 1 x daily - 7 x weekly - 3 sets - 10 reps - 3 hold ?- Heel Raises with Counter Support  - 1 x daily - 7 x weekly - 3 sets - 10 reps - 3 hold ?- Seated Heel Toe Raises  - 1 x daily - 7 x weekly - 3 sets - 10 reps - 2 hold ?- Hooklying Clamshell with Resistance  - 1 x daily - 7 x weekly - 3 sets - 10 reps - 3 hold ?- Standing Gastroc Stretch at [employer]  - 1 x daily - 7 x weekly - 1 sets - 3 reps - 30 hold ?- Seated Piriformis Stretch with Trunk Bend  - 1 x daily - 7 x weekly - 1 sets - 3-5 reps - 30 hold ?- Standing Hip Abduction with Counter Support  - 1 x daily - 7 x weekly - 3 sets - 10 reps - 3 hold ?  ?ASSESSMENT: ?  ?CLINICAL IMPRESSION: ?Pt participated in PT for lumbopelvic/LE flexibility and strengthening. Emphasis was placed on stretching the hip add/IR muscles and strengthening the hip abd/ER muscles. Following the therex, pt demonstrated improvement is his gait pattern c decreased scissoring when advancing his legs. Pt tolerated the session without adverse effects, and pt will continue to benefit from skilled PT to address impairments to optimize his functional mobility. ? ?OBJECTIVE IMPAIRMENTS Abnormal gait, decreased activity tolerance, decreased balance, difficulty walking, decreased ROM, decreased strength, impaired flexibility, and postural dysfunction.  ?  ?ACTIVITY LIMITATIONS cleaning, community activity, meal prep, yard work, shopping, and hobbies .  ?  ?PERSONAL FACTORS Past/current experiences, Time since onset of  injury/illness/exacerbation, and 1-2 comorbidities:    arthritis and previous neck surgery around 2000 are also affecting patient's functional outcome. ?  ?REHAB POTENTIAL: Good ?  ?CLINICAL DECISION MAKING: Evolving/m

## 2022-01-08 ENCOUNTER — Ambulatory Visit: Payer: Medicare HMO

## 2022-01-08 DIAGNOSIS — M25672 Stiffness of left ankle, not elsewhere classified: Secondary | ICD-10-CM

## 2022-01-08 DIAGNOSIS — M6281 Muscle weakness (generalized): Secondary | ICD-10-CM

## 2022-01-08 DIAGNOSIS — R262 Difficulty in walking, not elsewhere classified: Secondary | ICD-10-CM

## 2022-01-08 DIAGNOSIS — M25671 Stiffness of right ankle, not elsewhere classified: Secondary | ICD-10-CM

## 2022-01-08 NOTE — Therapy (Signed)
?OUTPATIENT PHYSICAL THERAPY TREATMENT NOTE ? ? ?Patient Name: Thomas Warner ?MRN: [id number] ?DOB:02-01-1961, 61 y.o., male ?Today's Date: 01/08/2022 ? ?PCP: Stevphen RochesterManfredi, Brenda L, MD ?REFERRING PROVIDER: Coletta Memosabbell, Kyle, MD ? ?END OF SESSION:  ? PT End of Session - 01/08/22 0904   ? ? Visit Number 8   ? Number of Visits 17   ? Date for PT Re-Evaluation 02/15/22   ? Authorization Type [employer] MEDICARE HMO; MEDICAID OF [place]   ? Authorization Time Period [employer] Approved 12 PT visits 12/14/2021-01/25/2022   ? Authorization - Visit Number 7   ? Authorization - Number of Visits 12   ? Progress Note Due on Visit 10   ? PT Start Time [phone]   ? PT Stop Time 0934   ? PT Time Calculation (min) 42 min   ? Equipment Utilized During Treatment Other (comment)   RW  ? Activity Tolerance Patient tolerated treatment well   ? Behavior [facility] for tasks assessed/performed   ? ?  ?  ? ?  ? ? ? ? ? ? ? ?Past Medical History:  ?Diagnosis Date  ? Arthritis   ? GERD (gastroesophageal reflux disease)   ? Hypertension   ? ?Past Surgical History:  ?Procedure Laterality Date  ? LUMBAR LAMINECTOMY/DECOMPRESSION MICRODISCECTOMY N/A 08/03/2021  ? Procedure: Thoracic Eleven-Twelve  Decompression;  Surgeon: Coletta Memosabbell, Kyle, MD;  [facility];  Service: Neurosurgery;  Laterality: N/A;  3C/RM 20  ? NECK SURGERY    ? approx 2000, plate and screws  ? ?Patient Active Problem List  ? Diagnosis Date Noted  ? Thoracic spinal stenosis 08/03/2021  ? ? ?REFERRING DIAG: spinal stenosis  ? ?THERAPY DIAG:  ?Difficulty in walking, not elsewhere classified ? ?Muscle weakness (generalized) ? ?Stiffness of left ankle, not elsewhere classified ? ?Stiffness of right ankle, not elsewhere classified ? ?PERTINENT HISTORY: Dr. Franky Machoabbell performed a thoracic laminectomy on the patient on 08/03/2021. ?Cervical fusion 20 yrs ago ? ? ?PRECAUTIONS: spasticity, abnormal gait and LE weakness ? ?SUBJECTIVE: Pt reports his legs are still scissoring, R>L, affecting his walking. Pt  reports consistent completion of his HEP ? ?PAIN:  ?Are you having pain? No ? ? ?DIAGNOSTIC FINDINGS:  ?  On: 06/23/2021 ?IMPRESSION: ?Cervical spine: ?  ?1. 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 ?2. Status post ACDF C3-C5. Congenitally short pedicles lead to at ?least mild spinal canal stenosis most cervical levels, including the ?fused levels. ?3. Multilevel facet and uncovertebral hypertrophy, worst at C6-C7, ?where it is severe bilaterally, and C5-C6, where it is moderate to ?severe bilaterally. ?  ?Thoracic spine: ?  ?1. T11-T12 severe spinal canal stenosis and severe bilateral neural ?foraminal narrowing. Increased T2 signal within the spinal cord at ?this level without apparent cord expansion, which may represent cord ?edema. ?2. Moderate to severe bilateral neural foraminal narrowing at T9-T10 ?and T10-T11. ?3. Moderate bilateral neural foraminal narrowing at T1-T2, T2-T3, ?and T3-T4. ?  ?Lumbar spine: ?  ?1. L2-L3 and L3-L4 moderate to severe thecal sac narrowing, ?secondary to severe disc height loss, disc osteophyte complexes, ?disc bulges, and epidural lipomatosis. L2-L3 moderate bilateral ?neural foraminal narrowing and L3-L4 moderate to severe bilateral ?neural foraminal narrowing. ?2. L4-L5 moderate thecal sac narrowing, due to the aforementioned ?factors, severe left and moderate to severe right neural foraminal ?narrowing. ?3. L5-S1 severe bilateral neural foraminal narrowing ?  ?  ?PATIENT SURVEYS:  ?FOTO 56%, 01/08/22= 49% ?  ?SCREENING FOR RED FLAGS: ?Bowel or bladder  incontinence: No ?Spinal tumors: No ?Cauda equina syndrome: No ?Compression fracture: No ?  ?COGNITION: ?          Overall cognitive status: Within functional limits for tasks assessed               ?           ?SENSATION: ?Not tested Pt reports the sensation of walking on bubble wrap on the bottom of his feet.  This is increased after the exercises.  ?  ?MUSCLE  LENGTH: ?Hamstrings: Right 35 deg; Left 38 deg ?Tight hip flexors, hip adductors, heel cords bilaterally ?  ?POSTURE:  ?Forward flexed trunk ?  ?PALPATION: ?NT ?  ?LE MMT: ?  ?MMT Right ?12/14/2021 Left ?12/14/2021  ?Hip flexion 3+ 3  ?Hip extension 2 2  ?Hip abduction 2 2  ?Hip adduction 4 4  ?Hip internal rotation 4 4  ?Hip external rotation 2 2  ?Knee flexion 3 3  ?Knee extension 4 4  ?Ankle dorsiflexion 2 2  ?Ankle plantarflexion 3 3  ?Ankle inversion      ?Ankle eversion      ? (Blank rows = not tested) ?  ?FUNCTIONAL TESTS:  ?5 times sit to stand: 26.4 s use of hands ?2 minute walk test: 151ft ?  ?GAIT: ?Distance walked: 106 ?Assistive device utilized: Environmental consultant - 4 wheeled ?Level of assistance: Complete Independence ?Comments: gait deficits include forward flexed trunk, scissor gait, drop foot, genu recurvatum in    stance phase ? ?[facility] Adult PT Treatment:                                                DATE: 01/08/22 ?Therapeutic Exercise: ?Nustep 5 mins L6 LE ?Seated frog leg stretch 3x 30" ?Standing hip add stretch 3x 30" ?Seated ankle DF 2x15 ?STS mat table, ball between knees 2x10 ?Side stepping at counter, wide steps, x12 31ft ?Standing hip abd RTB 3x10 ?Standing hip ext RTB 3x10 ?Manual Therapy: ?C/R frog leg (hip add/IR) stretch x5 1'  ? ?[facility] Adult PT Treatment:                                                DATE: 01/03/22 ?Therapeutic Exercise: ?Nustep 5 mins L6 UE/LE ?Seated frog leg stretch 3x 30" ?Seated hip ER GTB 2x15 ?Seated hip abd GTB 2x15 ?Seated ankle DF 2x15 ?STS bari mat table 2x10 ?Side stepping at counter, wide steps, x16 62ft ?Side stepping at counter c GTB x8 72ft ?Standing hip ext RTB 3x10 ?Manual Therapy: ?C/R frog leg ( hip add/IR) stretch x5 1'  ? ? ?[facility] Adult PT Treatment:                                                DATE: 01/01/22 ?Therapeutic Exercise: ?Seated figure 4 stretch 3x 20 sec each ?Seated hip abd c RTB 2x10 each ?Supine clams c GTB 2x10 ?Bridging 2x10 ?Standing c counter  assist hip abd 3x10 each ?Sit-stand from standard chair- no UE, 2x10 ?Updated HEP ? ?Therapeutic Activity: ?Gait training with AFOs using a RW ? ?PATIENT EDUCATION:  ?  Education details: Eval findings, POC, HEP ?Person educated: Patient ?Education method: Explanation, Demonstration, Tactile cues, Verbal cues, and Handouts ?Education comprehension: verbalized understanding, returned demonstration, verbal cues required, tactile cues required, and needs further education ?  ?  ?HOME EXERCISE PROGRAM: ?Access Code: [email or web address] ?URL: [email or web address] ?Date: 01/02/2022 ?Prepared by: Joellyn Rued ? ?Exercises ?- Sit to Stand Without Arm Support  - 1 x daily - 7 x weekly - 3 sets - 10 reps - 3 hold ?- Heel Raises with Counter Support  - 1 x daily - 7 x weekly - 3 sets - 10 reps - 3 hold ?- Seated Heel Toe Raises  - 1 x daily - 7 x weekly - 3 sets - 10 reps - 2 hold ?- Hooklying Clamshell with Resistance  - 1 x daily - 7 x weekly - 3 sets - 10 reps - 3 hold ?- Standing Gastroc Stretch at [employer]  - 1 x daily - 7 x weekly - 1 sets - 3 reps - 30 hold ?- Seated Piriformis Stretch with Trunk Bend  - 1 x daily - 7 x weekly - 1 sets - 3-5 reps - 30 hold ?- Standing Hip Abduction with Counter Support  - 1 x daily - 7 x weekly - 3 sets - 10 reps - 3 hold ?  ?ASSESSMENT: ?  ?CLINICAL IMPRESSION: ?PT was focused on increasing hip IR/add flexibility and strengthening hip ER/abd to improve pt's gait by decreasing LE scissoring when advancing each LE. Strengthening of the quads and hip xtensors was aslo completed to improve pt's functional mobility. Chronicity of pt's symptoms will mean slower progress for gains with impairments affecting function. With reassessment of FOTO, pt's score was decreased and reflects his impairments and challenging course of recovery. ? ?OBJECTIVE IMPAIRMENTS Abnormal gait, decreased activity tolerance, decreased balance, difficulty walking, decreased ROM, decreased strength, impaired  flexibility, and postural dysfunction.  ?  ?ACTIVITY LIMITATIONS cleaning, community activity, meal prep, yard work, shopping, and hobbies .  ?  ?PERSONAL FACTORS Past/current experiences, Time since onset of injury/illness/exacerba

## 2022-01-09 NOTE — Therapy (Signed)
?OUTPATIENT PHYSICAL THERAPY TREATMENT NOTE ? ? ?Patient Name: Thomas Warner ?MRN: [id number] ?DOB:26-Feb-1961, 61 y.o., male ?Today's Date: 01/10/2022 ? ?PCP: Stevphen Rochester, MD ?REFERRING PROVIDER: Coletta Memos, MD ? ?END OF SESSION:  ? PT End of Session - 01/10/22 0902   ? ? Visit Number 9   ? Number of Visits 17   ? Date for PT Re-Evaluation 02/15/22   ? Authorization Type HUMANA MEDICARE HMO; MEDICAID OF [place]   ? Authorization Time Period [employer] Approved 12 PT visits 12/14/2021-01/25/2022   ? Authorization - Visit Number 8   ? Authorization - Number of Visits 12   ? Progress Note Due on Visit 10   ? PT Start Time [phone]   ? PT Stop Time 0935   ? PT Time Calculation (min) 40 min   ? Equipment Utilized During Treatment Other (comment)   RW  ? Activity Tolerance Patient tolerated treatment well   ? Behavior During Therapy [facility] for tasks assessed/performed   ? ?  ?  ? ?  ? ? ? ? ? ? ? ? ?Past Medical History:  ?Diagnosis Date  ? Arthritis   ? GERD (gastroesophageal reflux disease)   ? Hypertension   ? ?Past Surgical History:  ?Procedure Laterality Date  ? LUMBAR LAMINECTOMY/DECOMPRESSION MICRODISCECTOMY N/A 08/03/2021  ? Procedure: Thoracic Eleven-Twelve  Decompression;  Surgeon: Coletta Memos, MD;  Location: [facility] OR;  Service: Neurosurgery;  Laterality: N/A;  3C/RM 20  ? NECK SURGERY    ? approx 2000, plate and screws  ? ?Patient Active Problem List  ? Diagnosis Date Noted  ? Thoracic spinal stenosis 08/03/2021  ? ? ?REFERRING DIAG: spinal stenosis  ? ?THERAPY DIAG:  ?Difficulty in walking, not elsewhere classified ? ?Muscle weakness (generalized) ? ?Stiffness of left ankle, not elsewhere classified ? ?Stiffness of right ankle, not elsewhere classified ? ?PERTINENT HISTORY: Dr. Franky Macho performed a thoracic laminectomy on the patient on 08/03/2021. ?Cervical fusion 20 yrs ago ? ? ?PRECAUTIONS: spasticity, abnormal gait and LE weakness ? ?SUBJECTIVE: Pt reports his legs are scissoring as much this Am. ? ?PAIN:  ?Are you  having pain? No ? ? ?DIAGNOSTIC FINDINGS:  ?  On: 06/23/2021 ?IMPRESSION: ?Cervical spine: ?  ?1. 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 ?2. Status post ACDF C3-C5. Congenitally short pedicles lead to at ?least mild spinal canal stenosis most cervical levels, including the ?fused levels. ?3. Multilevel facet and uncovertebral hypertrophy, worst at C6-C7, ?where it is severe bilaterally, and C5-C6, where it is moderate to ?severe bilaterally. ?  ?Thoracic spine: ?  ?1. T11-T12 severe spinal canal stenosis and severe bilateral neural ?foraminal narrowing. Increased T2 signal within the spinal cord at ?this level without apparent cord expansion, which may represent cord ?edema. ?2. Moderate to severe bilateral neural foraminal narrowing at T9-T10 ?and T10-T11. ?3. Moderate bilateral neural foraminal narrowing at T1-T2, T2-T3, ?and T3-T4. ?  ?Lumbar spine: ?  ?1. L2-L3 and L3-L4 moderate to severe thecal sac narrowing, ?secondary to severe disc height loss, disc osteophyte complexes, ?disc bulges, and epidural lipomatosis. L2-L3 moderate bilateral ?neural foraminal narrowing and L3-L4 moderate to severe bilateral ?neural foraminal narrowing. ?2. L4-L5 moderate thecal sac narrowing, due to the aforementioned ?factors, severe left and moderate to severe right neural foraminal ?narrowing. ?3. L5-S1 severe bilateral neural foraminal narrowing ?  ?  ?PATIENT SURVEYS:  ?FOTO 56%, 01/08/22= 49% ?  ?SCREENING FOR RED FLAGS: ?Bowel or bladder incontinence: No ?Spinal tumors: No ?Cauda equina  syndrome: No ?Compression fracture: No ?  ?COGNITION: ?          Overall cognitive status: Within functional limits for tasks assessed               ?           ?SENSATION: ?Not tested Pt reports the sensation of walking on bubble wrap on the bottom of his feet.  This is increased after the exercises.  ?  ?MUSCLE LENGTH: ?Hamstrings: Right 35 deg; Left 38 deg ?Tight hip flexors, hip  adductors, heel cords bilaterally ?  ?POSTURE:  ?Forward flexed trunk ?  ?PALPATION: ?NT ?  ?LE MMT: ?  ?MMT Right ?12/14/2021 Left ?12/14/2021  ?Hip flexion 3+ 3  ?Hip extension 2 2  ?Hip abduction 2 2  ?Hip adduction 4 4  ?Hip internal rotation 4 4  ?Hip external rotation 2 2  ?Knee flexion 3 3  ?Knee extension 4 4  ?Ankle dorsiflexion 2 2  ?Ankle plantarflexion 3 3  ?Ankle inversion      ?Ankle eversion      ? (Blank rows = not tested) ?  ?FUNCTIONAL TESTS:  ?5 times sit to stand: 26.4 s use of hands ?2 minute walk test: 1109ft ?  ?GAIT: ?Distance walked: 106 ?Assistive device utilized: Environmental consultant - 4 wheeled ?Level of assistance: Complete Independence ?Comments: gait deficits include forward flexed trunk, scissor gait, drop foot, genu recurvatum in    stance phase ? ?[facility] Adult PT Treatment:                                                DATE: 01/10/22 ?Therapeutic Exercise: ?Nustep 5 mins L6 LE ?Seated frog leg stretch 3x 30" ?Standing hip add stretch 3x 30" ?Seated ankle DF 2x15 ?STS mat table, ball between knees 2x10, more difficult without knee touching ?Quadruped Alt arm lifts x10 each. Mule kicks x4 each ?Side stepping at counter, wide steps, x12 59ft ?Standing hip abd 3# 3x10 ?Standing hip ext 5# 3x10 ?Manual Therapy: ?C/R frog leg (hip add/IR) stretch x5 1'  ? ?[place] Adult PT Treatment:                                                DATE: 01/08/22 ?Therapeutic Exercise: ?Nustep 5 mins L6 LE ?Seated frog leg stretch 3x 30" ?Standing hip add stretch 3x 30" ?Seated ankle DF 2x15 ?STS mat table, ball between knees 2x10 ?Side stepping at counter, wide steps, x12 15ft ?Standing hip abd RTB 3x10 ?Standing hip ext RTB 3x10 ?Manual Therapy: ?C/R frog leg (hip add/IR) stretch x5 1'  ? ?[place] Adult PT Treatment:                                                DATE: 01/03/22 ?Therapeutic Exercise: ?Nustep 5 mins L6 UE/LE ?Seated frog leg stretch 3x 30" ?Seated hip ER GTB 2x15 ?Seated hip abd GTB 2x15 ?Seated ankle DF 2x15 ?STS  bari mat table 2x10 ?Side stepping at counter, wide steps, x16 68ft ?Side stepping at counter c GTB x8 6ft ?Standing hip ext RTB 3x10 ?Manual  Therapy: ?C/R frog leg ( hip add/IR) stretch x5 1'  ? ? ?[place] Adult PT Treatment:                                                DATE: 01/01/22 ?Therapeutic Exercise: ?Seated figure 4 stretch 3x 20 sec each ?Seated hip abd c RTB 2x10 each ?Supine clams c GTB 2x10 ?Bridging 2x10 ?Standing c counter assist hip abd 3x10 each ?Sit-stand from standard chair- no UE, 2x10 ?Updated HEP ? ?Therapeutic Activity: ?Gait training with AFOs using a RW ? ?PATIENT EDUCATION:  ?Education details: Eval findings, POC, HEP ?Person educated: Patient ?Education method: Explanation, Demonstration, Tactile cues, Verbal cues, and Handouts ?Education comprehension: verbalized understanding, returned demonstration, verbal cues required, tactile cues required, and needs further education ?  ?  ?HOME EXERCISE PROGRAM: ?Access Code: [email or web address] ?URL: [email or web address] ?Date: 01/02/2022 ?Prepared by: Gar Ponto ? ?Exercises ?- Sit to Stand Without Arm Support  - 1 x daily - 7 x weekly - 3 sets - 10 reps - 3 hold ?- Heel Raises with Counter Support  - 1 x daily - 7 x weekly - 3 sets - 10 reps - 3 hold ?- Seated Heel Toe Raises  - 1 x daily - 7 x weekly - 3 sets - 10 reps - 2 hold ?- Hooklying Clamshell with Resistance  - 1 x daily - 7 x weekly - 3 sets - 10 reps - 3 hold ?- Standing Gastroc Stretch at [employer]  - 1 x daily - 7 x weekly - 1 sets - 3 reps - 30 hold ?- Seated Piriformis Stretch with Trunk Bend  - 1 x daily - 7 x weekly - 1 sets - 3-5 reps - 30 hold ?- Standing Hip Abduction with Counter Support  - 1 x daily - 7 x weekly - 3 sets - 10 reps - 3 hold ?  ?ASSESSMENT: ?  ?CLINICAL IMPRESSION: ?PT for hip/LE/trunk strengthening and for flexibility of the hip IR/adds was completed. Pt demonstrated decreased scissoring of his legs during ambulation today. Hip adductors and internal  rotators are very tight. Pt tolerated the session without adverse effects. Pt will cintinue to benefit from skilled PT to address impairments to maximize LE function. ? ?OBJECTIVE IMPAIRMENTS Abnormal gait, decreased

## 2022-01-10 ENCOUNTER — Ambulatory Visit: Payer: Medicare HMO

## 2022-01-10 DIAGNOSIS — R262 Difficulty in walking, not elsewhere classified: Secondary | ICD-10-CM

## 2022-01-10 DIAGNOSIS — M25671 Stiffness of right ankle, not elsewhere classified: Secondary | ICD-10-CM

## 2022-01-10 DIAGNOSIS — M6281 Muscle weakness (generalized): Secondary | ICD-10-CM

## 2022-01-10 DIAGNOSIS — M25672 Stiffness of left ankle, not elsewhere classified: Secondary | ICD-10-CM

## 2022-01-18 ENCOUNTER — Ambulatory Visit: Payer: Medicare HMO

## 2022-01-18 DIAGNOSIS — M6281 Muscle weakness (generalized): Secondary | ICD-10-CM

## 2022-01-18 DIAGNOSIS — R262 Difficulty in walking, not elsewhere classified: Secondary | ICD-10-CM

## 2022-01-18 DIAGNOSIS — M25671 Stiffness of right ankle, not elsewhere classified: Secondary | ICD-10-CM

## 2022-01-18 DIAGNOSIS — M25672 Stiffness of left ankle, not elsewhere classified: Secondary | ICD-10-CM

## 2022-01-18 NOTE — Therapy (Signed)
OUTPATIENT PHYSICAL THERAPY TREATMENT NOTE/10th VISIT PROGRESS NOTE   Patient Name: Thomas Warner MRN: [id number] DOB:1960/10/13, 61 y.o., male Today's Date: 01/18/2022  PCP: Jolinda Croak, MD REFERRING PROVIDER: Jolinda Croak, MD Physical Therapy Progress Note   Dates of Reporting Period: 12/13/21-01/18/22  See Note below for Objective Data and Assessment of Progress/Goals.   END OF SESSION:   PT End of Session - 01/18/22 0912     Visit Number 10    Number of Visits 17    Date for PT Re-Evaluation 02/15/22    Authorization Type HUMANA MEDICARE HMO; [place] Approved 12 PT visits 12/14/2021-01/25/2022    Authorization - Visit Number 8    Authorization - Number of Visits 12    Progress Note Due on Visit 10    PT Start Time 0915    PT Stop Time 0945    PT Time Calculation (min) 30 min    Equipment Utilized During Treatment Other (comment)   RW   Activity Tolerance Patient tolerated treatment well    Behavior During Therapy WFL for tasks assessed/performed                   Past Medical History:  Diagnosis Date   Arthritis    GERD (gastroesophageal reflux disease)    Hypertension    Past Surgical History:  Procedure Laterality Date   LUMBAR LAMINECTOMY/DECOMPRESSION MICRODISCECTOMY N/A 08/03/2021   Procedure: Thoracic Eleven-Twelve  Decompression;  Surgeon: Ashok Pall, MD;  Location: [place];  Service: Neurosurgery;  Laterality: N/A;  3C/RM 20   NECK SURGERY     approx 2000, plate and screws   Patient Active Problem List   Diagnosis Date Noted   Thoracic spinal stenosis 08/03/2021    REFERRING DIAG: spinal stenosis   THERAPY DIAG:  Difficulty in walking, not elsewhere classified  Muscle weakness (generalized)  Stiffness of left ankle, not elsewhere classified  Stiffness of right ankle, not elsewhere classified  PERTINENT HISTORY: Dr. Christella Noa performed a thoracic laminectomy on the patient on  08/03/2021. Cervical fusion 20 yrs ago   PRECAUTIONS: spasticity, abnormal gait and LE weakness  SUBJECTIVE: Reporting and increase in low back pain following new exercises, described as sore muscles, improving with rest.  PAIN:  Are you having pain? 5/10 low back, achy in nature, worst with activity and better with rest.   DIAGNOSTIC FINDINGS:    On: 06/23/2021 IMPRESSION: Cervical spine:   1. 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 2. Status post ACDF C3-C5. 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 3. Multilevel facet and uncovertebral hypertrophy, worst at C6-C7, where it is severe bilaterally, and C5-C6, where it is moderate to severe bilaterally.   Thoracic spine:   1. T11-T12 severe spinal canal stenosis and severe bilateral neural foraminal narrowing. Increased T2 signal within the spinal cord at this level without apparent cord expansion, which may represent cord edema. 2. Moderate to severe bilateral neural foraminal narrowing at T9-T10 and T10-T11. 3. Moderate bilateral neural foraminal narrowing at T1-T2, T2-T3, and T3-T4.   Lumbar spine:   1. L2-L3 and L3-L4 moderate to severe thecal sac narrowing, secondary to severe disc height loss, disc osteophyte complexes, disc bulges, and epidural lipomatosis. L2-L3 moderate bilateral neural foraminal narrowing and L3-L4 moderate to severe bilateral neural foraminal narrowing. 2. L4-L5 moderate thecal sac narrowing, due to the  aforementioned factors, severe left and moderate to severe right neural foraminal narrowing. 3. L5-S1 severe bilateral neural foraminal narrowing     PATIENT SURVEYS:  FOTO 56%, 01/08/22= 49%   SCREENING FOR RED FLAGS: Bowel or bladder incontinence: No Spinal tumors: No Cauda equina syndrome: No Compression fracture: No   COGNITION:           Overall  cognitive status: Within functional limits for tasks assessed                          SENSATION: Not tested Pt reports the sensation of walking on bubble wrap on the bottom of his feet.  This is increased after the exercises.    MUSCLE LENGTH: Hamstrings: Right 35 deg; Left 38 deg Tight hip flexors, hip adductors, heel cords bilaterally   POSTURE:  Forward flexed trunk   PALPATION: NT   LE MMT:   MMT Right 12/14/2021 Left 12/14/2021 R 01/18/22 L 01/18/22  Hip flexion 3+ 3 3+ 3+  Hip extension _0 Hip abduction _1 Hip adduction _2 Hip internal rotation _3 Hip external rotation _4 Knee flexion 3 3 3+ 4-  Knee extension 4 4 3+ 3+  Ankle dorsiflexion _5 Ankle plantarflexion _6 Ankle inversion        Ankle eversion         (Blank rows = not tested)   FUNCTIONAL TESTS:  5 times sit to stand: 26.4 s use of hands; 01/18/22 19s 2 minute walk test: 147f; 01/18/22 1136f  GAIT: Distance walked: 106 Assistive device utilized: WaEnvironmental consultant 4 wheeled Level of assistance: Complete Independence Comments: gait deficits include forward flexed trunk, scissor gait, drop foot, genu recurvatum in    stance phase [facility] Adult PT Treatment:                                                DATE: 01/18/22 Therapeutic Exercise: Seated hamstring/adductor stretch 30s x2  Seated ankle DF 30x on RB Side stepping at counter, wide steps, x12 1045f6x fwd, 6x bwd  Re-eval   [facility] Adult PT Treatment:                                                DATE: 01/10/22 Therapeutic Exercise: Nustep 5 mins L6 LE Seated frog leg stretch 3x 30" Standing hip add stretch 3x 30" Seated ankle DF 2x15 STS mat table, ball between knees 2x10, more difficult without knee touching Quadruped Alt arm lifts x10 each. Mule kicks x4 each Side stepping at counter, wide steps, x12 50f8fanding hip abd 3# 3x10 Standing hip ext 5# 3x10 Manual Therapy: C/R frog leg (hip add/IR) stretch x5  1'   [facility]                                                [facility]: 01/08/22 Therapeutic Exercise: Nustep 5 mins L6 LE  Seated frog leg stretch 3x 30" Standing hip add stretch 3x 30" Seated ankle DF 2x15 STS mat table, ball between knees 2x10 Side stepping at counter, wide steps, x12 109f Standing hip abd RTB 3x10 Standing hip ext RTB 3x10 Manual Therapy: C/R frog leg (hip add/IR) stretch x5 1'   [facility] Adult PT Treatment:                                                DATE: 01/03/22 Therapeutic Exercise: Nustep 5 mins L6 UE/LE Seated frog leg stretch 3x 30" Seated hip ER GTB 2x15 Seated hip abd GTB 2x15 Seated ankle DF 2x15 STS bari mat table 2x10 Side stepping at counter, wide steps, x16 185fSide stepping at counter c GTB x8 1021ftanding hip ext RTB 3x10 Manual Therapy: C/R frog leg ( hip add/IR) stretch x5 1'    [facility] Adult PT Treatment:                                                DATE: 01/01/22 Therapeutic Exercise: Seated figure 4 stretch 3x 20 sec each Seated hip abd c RTB 2x10 each Supine clams c GTB 2x10 Bridging 2x10 Standing c counter assist hip abd 3x10 each Sit-stand from standard chair- no UE, 2x10 Updated HEP  Therapeutic Activity: Gait training with AFOs using a RW  PATIENT EDUCATION:  Education details: Eval findings, POC, HEP Person educated: Patient Education method: Explanation, Demonstration, Tactile cues, Verbal cues, and Handouts Education comprehension: verbalized understanding, returned demonstration, verbal cues required, tactile cues required, and needs further education     HOME EXERCISE PROGRAM: Access Code: [email or web address] URL: [email or web address] Date: 01/02/2022 Prepared by: AllGar Pontoxercises - Sit to Stand Without Arm Support  - 1 x daily - 7 x weekly - 3 sets - 10 reps - 3 hold - Heel Raises with Counter Support  - 1 x daily - 7 x weekly - 3 sets - 10 reps - 3 hold - Seated Heel Toe Raises  - 1 x daily  - 7 x weekly - 3 sets - 10 reps - 2 hold - Hooklying Clamshell with Resistance  - 1 x daily - 7 x weekly - 3 sets - 10 reps - 3 hold - Standing Gastroc Stretch at Counter  - 1 x daily - 7 x weekly - 1 sets - 3 reps - 30 hold - Seated Piriformis Stretch with Trunk Bend  - 1 x daily - 7 x weekly - 1 sets - 3-5 reps - 30 hold - Standing Hip Abduction with Counter Support  - 1 x daily - 7 x weekly - 3 sets - 10 reps - 3 hold   ASSESSMENT:   CLINICAL IMPRESSION: Re-assessment today finds increased strength throughout B Les, 5x STS goal met, 2 MWT assessed with goal ongoing, ambulaiton tolerance 200f12fth RW and SBA, gait pattern remains restricted due to underlying tonal issues throughout LEs especially adductors and PFs.  Patient progressing as expected and will continue to benefit from ongoing PT services to adress remaining goals and deficits.  OBJECTIVE IMPAIRMENTS Abnormal gait, decreased activity tolerance, decreased balance, difficulty walking, decreased ROM, decreased strength, impaired flexibility, and postural dysfunction.    ACTIVITY LIMITATIONS cleaning,  community activity, meal prep, yard work, shopping, and hobbies .    PERSONAL FACTORS Past/current experiences, Time since onset of injury/illness/exacerbation, and 1-2 comorbidities:    arthritis and previous neck surgery around 2000 are also affecting patient's functional outcome.   REHAB POTENTIAL: Good   CLINICAL DECISION MAKING: Evolving/moderate complexity   EVALUATION COMPLEXITY: Moderate     GOALS:   SHORT TERM GOALS: Target date: 12/28/2021   Pt will be Ind in an initial HEP Baseline:started on eval Goal status: MET   LONG TERM GOALS: Target date: 02/15/22   Increase pt's 2 min walking test to 239f or more as indication of improved function Baseline: 106 c RW; 2MWT distance 1110fwith RW and SBA Goal status: Ongoing   2.  Decreased pt's 5xSTS time to 20" or less s use of hands Baseline: 26.4 sec without use of  hands; 01/18/22 19s using hands on lap Goal status: MET   3.  Increase pt's hip strength to at least 3+/5 and knee strength to at least 4+/5 for improved function of the LEs     Baseline: See flow sheets Goal status: INITIAL   4.  Pt will demonstrate improve gait walking with a LRAD, improved gait pattern, and for an increased distance of 40046faseline: 106 ft c RW; 01/18/22 200f35fth TW and SBA Goal status: INITIAL   5.  Pt will be Ind in a final HEP to maintain achieved LOF Baseline: started on eval Goal status: INITIAL   PLAN: PT FREQUENCY: 2x/week   PT DURATION: 8 weeks   PLANNED INTERVENTIONS: Therapeutic exercises, Therapeutic activity, Neuromuscular re-education, Balance training, Gait training, Patient/Family education, Joint mobilization, Stair training, Aquatic Therapy, Dry Needling, Electrical stimulation, Cryotherapy, Moist heat, Taping, Ultrasound, Ionotophoresis 4mg/27mDexamethasone, and Manual therapy.   PLAN FOR NEXT SESSION: Continue to address flexibility and strength. Continue assessing LTGs. AFOs?   Jeff Leroy Sea05/19/23 10:02 AM

## 2022-01-22 ENCOUNTER — Ambulatory Visit: Payer: Medicare HMO

## 2022-01-22 NOTE — Therapy (Incomplete)
OUTPATIENT PHYSICAL THERAPY TREATMENT NOTE/10th VISIT PROGRESS NOTE   Patient Name: Thomas Warner MRN: [id number] DOB:[birth year], 61 y.o., male Today's Date: 01/22/2022  PCP: Jolinda Croak, MD REFERRING PROVIDER: Jolinda Croak, MD Physical Therapy Progress Note   Dates of Reporting Period: 12/13/21-01/18/22  See Note below for Objective Data and Assessment of Progress/Goals.   END OF SESSION:           Past Medical History:  Diagnosis Date   Arthritis    GERD (gastroesophageal reflux disease)    Hypertension    Past Surgical History:  Procedure Laterality Date   LUMBAR LAMINECTOMY/DECOMPRESSION MICRODISCECTOMY N/A 08/03/2021   Procedure: Thoracic Eleven-Twelve  Decompression;  Surgeon: Ashok Pall, MD;  Location: [facility];  Service: Neurosurgery;  Laterality: N/A;  3C/RM 20   NECK SURGERY     approx 2000, plate and screws   Patient Active Problem List   Diagnosis Date Noted   Thoracic spinal stenosis 08/03/2021    REFERRING DIAG: spinal stenosis   THERAPY DIAG:  No diagnosis found.  PERTINENT HISTORY: Dr. Christella Noa performed a thoracic laminectomy on the patient on 08/03/2021. Cervical fusion 20 yrs ago   PRECAUTIONS: spasticity, abnormal gait and LE weakness  SUBJECTIVE:  ***  PAIN:  Are you having pain? 5/10 low back, achy in nature, worst with activity and better with rest.  OBJECTIVE:  PATIENT SURVEYS:  FOTO 56%, 01/08/22= 49%   COGNITION: Overall cognitive status: Within functional limits for tasks assessed                          SENSATION: Not tested Pt reports the sensation of walking on bubble wrap on the bottom of his feet.  This is increased after the exercises.    MUSCLE LENGTH: Hamstrings: Right 35 deg; Left 38 deg Tight hip flexors, hip adductors, heel cords bilaterally   POSTURE:  Forward flexed trunk   PALPATION: NT   LE MMT:   MMT Right 12/14/2021 Left 12/14/2021 R 01/18/22 L 01/18/22  Hip flexion 3+ 3 3+ 3+   Hip extension 2 2 3 3   Hip abduction 2 2 3 3   Hip adduction 4 4 4 4   Hip internal rotation 4 4 4 4   Hip external rotation 2 2 3 3   Knee flexion 3 3 3+ 4-  Knee extension 4 4 3+ 3+  Ankle dorsiflexion 2 2 3 3   Ankle plantarflexion 3 3 3 3   Ankle inversion        Ankle eversion         (Blank rows = not tested)   FUNCTIONAL TESTS:  5 times sit to stand: 26.4 s use of hands; 01/18/22 19s 2 minute walk test: 168f; 01/18/22 1138f  GAIT: Distance walked: 106 Assistive device utilized: WaEnvironmental consultant 4 wheeled Level of assistance: Complete Independence Comments: gait deficits include forward flexed trunk, scissor gait, drop foot, genu recurvatum in    stance phase   [facility] Adult PT Treatment:                                                DATE: 01/18/22 Therapeutic Exercise: Seated hamstring/adductor stretch 30s x2  Seated ankle DF 30x on RB Side stepping at counter, wide steps, x12 1064f6x fwd, 6x bwd  Re-eval   [facility] Adult PT Treatment:  DATE: 01/10/22 Therapeutic Exercise: Nustep 5 mins L6 LE Seated frog leg stretch 3x 30" Standing hip add stretch 3x 30" Seated ankle DF 2x15 STS mat table, ball between knees 2x10, more difficult without knee touching Quadruped Alt arm lifts x10 each. Mule kicks x4 each Side stepping at counter, wide steps, x12 36f Standing hip abd 3# 3x10 Standing hip ext 5# 3x10 Manual Therapy: C/R frog leg (hip add/IR) stretch x5 1'   [facility] Adult PT Treatment:                                                DATE: 01/08/22 Therapeutic Exercise: Nustep 5 mins L6 LE Seated frog leg stretch 3x 30" Standing hip add stretch 3x 30" Seated ankle DF 2x15 STS mat table, ball between knees 2x10 Side stepping at counter, wide steps, x12 163fStanding hip abd RTB 3x10 Standing hip ext RTB 3x10 Manual Therapy: C/R frog leg (hip add/IR) stretch x5 1'   [facility] Adult PT Treatment:                                                 DATE: 01/03/22 Therapeutic Exercise: Nustep 5 mins L6 UE/LE Seated frog leg stretch 3x 30" Seated hip ER GTB 2x15 Seated hip abd GTB 2x15 Seated ankle DF 2x15 STS bari mat table 2x10 Side stepping at counter, wide steps, x16 1054fide stepping at counter c GTB x8 43f20fanding hip ext RTB 3x10 Manual Therapy: C/R frog leg ( hip add/IR) stretch x5 1'    [facility] Adult PT Treatment:                                                DATE: 01/01/22 Therapeutic Exercise: Seated figure 4 stretch 3x 20 sec each Seated hip abd c RTB 2x10 each Supine clams c GTB 2x10 Bridging 2x10 Standing c counter assist hip abd 3x10 each Sit-stand from standard chair- no UE, 2x10 Updated HEP  Therapeutic Activity: Gait training with AFOs using a RW  PATIENT EDUCATION:  Education details: Eval findings, POC, HEP Person educated: Patient Education method: Explanation, Demonstration, Tactile cues, Verbal cues, and Handouts Education comprehension: verbalized understanding, returned demonstration, verbal cues required, tactile cues required, and needs further education     HOME EXERCISE PROGRAM: Access Code: [email or web address] URL: [email or web address] Date: 01/02/2022 Prepared by: AlleGar Pontoercises - Sit to Stand Without Arm Support  - 1 x daily - 7 x weekly - 3 sets - 10 reps - 3 hold - Heel Raises with Counter Support  - 1 x daily - 7 x weekly - 3 sets - 10 reps - 3 hold - Seated Heel Toe Raises  - 1 x daily - 7 x weekly - 3 sets - 10 reps - 2 hold - Hooklying Clamshell with Resistance  - 1 x daily - 7 x weekly - 3 sets - 10 reps - 3 hold - Standing Gastroc Stretch at Counter  - 1 x daily - 7 x weekly - 1 sets - 3 reps - 30 hold - Seated Piriformis Stretch with Trunk Bend  -  1 x daily - 7 x weekly - 1 sets - 3-5 reps - 30 hold - Standing Hip Abduction with Counter Support  - 1 x daily - 7 x weekly - 3 sets - 10 reps - 3 hold   ASSESSMENT:   CLINICAL IMPRESSION: ***  OBJECTIVE  IMPAIRMENTS Abnormal gait, decreased activity tolerance, decreased balance, difficulty walking, decreased ROM, decreased strength, impaired flexibility, and postural dysfunction.    ACTIVITY LIMITATIONS cleaning, community activity, meal prep, yard work, shopping, and hobbies .    PERSONAL FACTORS Past/current experiences, Time since onset of injury/illness/exacerbation, and 1-2 comorbidities:    arthritis and previous neck surgery around 2000 are also affecting patient's functional outcome.   REHAB POTENTIAL: Good   CLINICAL DECISION MAKING: Evolving/moderate complexity   EVALUATION COMPLEXITY: Moderate     GOALS:   SHORT TERM GOALS: Target date: 12/28/2021   Pt will be Ind in an initial HEP Baseline:started on eval Goal status: MET   LONG TERM GOALS: Target date: 02/15/22   Increase pt's 2 min walking test to 218f or more as indication of improved function Baseline: 106 c RW; 2MWT distance 1142fwith RW and SBA Goal status: Ongoing   2.  Decreased pt's 5xSTS time to 20" or less s use of hands Baseline: 26.4 sec without use of hands; 01/18/22 19s using hands on lap Goal status: MET   3.  Increase pt's hip strength to at least 3+/5 and knee strength to at least 4+/5 for improved function of the LEs     Baseline: See flow sheets Goal status: INITIAL   4.  Pt will demonstrate improve gait walking with a LRAD, improved gait pattern, and for an increased distance of 40076faseline: 106 ft c RW; 01/18/22 200f22fth TW and SBA Goal status: INITIAL   5.  Pt will be Ind in a final HEP to maintain achieved LOF Baseline: started on eval Goal status: INITIAL   PLAN: PT FREQUENCY: 2x/week   PT DURATION: 8 weeks   PLANNED INTERVENTIONS: Therapeutic exercises, Therapeutic activity, Neuromuscular re-education, Balance training, Gait training, Patient/Family education, Joint mobilization, Stair training, Aquatic Therapy, Dry Needling, Electrical stimulation, Cryotherapy, Moist heat,  Taping, Ultrasound, Ionotophoresis 4mg/4mDexamethasone, and Manual therapy.   PLAN FOR NEXT SESSION: Continue to address flexibility and strength. Continue assessing LTGs. AFOs?   DavidWard Chatters05/23/23 9:08 AM

## 2022-01-25 ENCOUNTER — Ambulatory Visit: Payer: Medicare HMO

## 2022-01-25 DIAGNOSIS — R262 Difficulty in walking, not elsewhere classified: Secondary | ICD-10-CM | POA: Diagnosis not present

## 2022-01-25 DIAGNOSIS — M6281 Muscle weakness (generalized): Secondary | ICD-10-CM

## 2022-01-25 DIAGNOSIS — M25671 Stiffness of right ankle, not elsewhere classified: Secondary | ICD-10-CM

## 2022-01-25 DIAGNOSIS — M25672 Stiffness of left ankle, not elsewhere classified: Secondary | ICD-10-CM

## 2022-01-25 NOTE — Therapy (Signed)
OUTPATIENT PHYSICAL THERAPY TREATMENT NOTE/10th VISIT PROGRESS NOTE/RE-AUTH   Patient Name: Thomas Warner MRN: [id number] DOB:[birth year], 61 y.o., male Today's Date: 01/25/2022  PCP: Jolinda Croak, MD REFERRING PROVIDER: Jolinda Croak, MD  See Note below for Objective Data and Assessment of Progress/Goals.   END OF SESSION:   PT End of Session - 01/25/22 0944     Visit Number 11    Number of Visits 23    Date for PT Re-Evaluation 02/15/22    Authorization Type [employer] HMO; [place] Approved 12 PT visits 12/14/2021-01/25/2022    Authorization - Visit Number 10    Authorization - Number of Visits 12    Progress Note Due on Visit 10    PT Start Time 0935    PT Stop Time 1017    PT Time Calculation (min) 42 min    Equipment Utilized During Treatment Other (comment)   RW   Activity Tolerance Patient tolerated treatment well    Behavior During Therapy WFL for tasks assessed/performed                    Past Medical History:  Diagnosis Date   Arthritis    GERD (gastroesophageal reflux disease)    Hypertension    Past Surgical History:  Procedure Laterality Date   LUMBAR LAMINECTOMY/DECOMPRESSION MICRODISCECTOMY N/A 08/03/2021   Procedure: Thoracic Eleven-Twelve  Decompression;  Surgeon: Ashok Pall, MD;  Location: [facility];  Service: Neurosurgery;  Laterality: N/A;  3C/RM 20   NECK SURGERY     approx 2000, plate and screws   Patient Active Problem List   Diagnosis Date Noted   Thoracic spinal stenosis 08/03/2021    REFERRING DIAG: spinal stenosis   THERAPY DIAG:  Difficulty in walking, not elsewhere classified  Muscle weakness (generalized)  Stiffness of left ankle, not elsewhere classified  Stiffness of right ankle, not elsewhere classified  PERTINENT HISTORY: Dr. Christella Noa performed a thoracic laminectomy on the patient on 08/03/2021. Cervical fusion 20 yrs ago   PRECAUTIONS: spasticity,  abnormal gait and LE weakness  SUBJECTIVE: Pt reports 25% improvement in the frequency of his legs crossing midline when walking. It is wrose in the AM prior completing his HEP.  PAIN:  Are you having pain? 5/10 low back, achy in nature, worst with activity and better with rest.   DIAGNOSTIC FINDINGS:    On: 06/23/2021 IMPRESSION: Cervical spine:   1. 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 2. Status post ACDF C3-C5. 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 3. Multilevel facet and uncovertebral hypertrophy, worst at C6-C7, where it is severe bilaterally, and C5-C6, where it is moderate to severe bilaterally.   Thoracic spine:   1. T11-T12 severe spinal canal stenosis and severe bilateral neural foraminal narrowing. Increased T2 signal within the spinal cord at this level without apparent cord expansion, which may represent cord edema. 2. Moderate to severe bilateral neural foraminal narrowing at T9-T10 and T10-T11. 3. Moderate bilateral neural foraminal narrowing at T1-T2, T2-T3, and T3-T4.   Lumbar spine:   1. L2-L3 and L3-L4 moderate to severe thecal sac narrowing, secondary to severe disc height loss, disc osteophyte complexes, disc bulges, and epidural lipomatosis. L2-L3 moderate bilateral neural foraminal narrowing and L3-L4 moderate to severe bilateral neural foraminal narrowing. 2. L4-L5 moderate thecal sac narrowing, due to the aforementioned factors, severe  left and moderate to severe right neural foraminal narrowing. 3. L5-S1 severe bilateral neural foraminal narrowing     PATIENT SURVEYS:  FOTO 56%, 01/08/22= 49%   SCREENING FOR RED FLAGS: Bowel or bladder incontinence: No Spinal tumors: No Cauda equina syndrome: No Compression fracture: No   COGNITION:           Overall cognitive status: Within functional limits for  tasks assessed                          SENSATION: Not tested Pt reports the sensation of walking on bubble wrap on the bottom of his feet.  This is increased after the exercises.    MUSCLE LENGTH: Hamstrings: Right 35 deg; Left 38 deg Tight hip flexors, hip adductors, heel cords bilaterally   POSTURE:  Forward flexed trunk   PALPATION: NT   LE MMT:   MMT Right 12/14/2021 Left 12/14/2021 R 01/18/22 L 01/18/22  Hip flexion 3+ 3 3+ 3+  Hip extension _0 Hip abduction _1 Hip adduction _2 Hip internal rotation _3 Hip external rotation _4 Knee flexion 3 3 3+ 4-  Knee extension 4 4 3+ 3+  Ankle dorsiflexion _5 Ankle plantarflexion _6 Ankle inversion        Ankle eversion         (Blank rows = not tested)   FUNCTIONAL TESTS:  5 times sit to stand: 26.4 s use of hands; 01/18/22 19s 2 minute walk test: 171f; 01/18/22 1141f  GAIT: Distance walked: 106 Assistive device utilized: WaEnvironmental consultant 4 wheeled Level of assistance: Complete Independence Comments: gait deficits include forward flexed trunk, scissor gait, drop foot, genu recurvatum in    stance phase  [facility] Adult PT Treatment:                                                DATE: 01/25/22 Therapeutic Exercise: NuStep 59m22m L6 UE/LE Bridging 3x10 3" Supine hip clams 2x15 BluTB SL hip abd 3x10  Seated STS 2x10 Lateral steps 57f61fat counter Bilat DF stretch at counter x3 20"   [facility] Treatment:                                                DATE: 01/18/22 Therapeutic Exercise: Seated hamstring/adductor stretch 30s x2  Seated ankle DF 30x on RB Side stepping at counter, wide steps, x12 57ft17f fwd, 6x bwd  Re-eval   [facility] Adult PT Treatment:                                                DATE: 01/10/22 Therapeutic Exercise: Nustep 5 mins L6 LE Seated frog leg stretch 3x 30" Standing hip add stretch 3x 30" Seated ankle DF 2x15 STS mat table, ball between knees 2x10,  more difficult without knee touching Quadruped Alt arm lifts x10 each. Mule kicks x4 each Side stepping at  counter, wide steps, x12 63ft Standing hip abd 3# 3x10 Standing hip ext 5# 3x10 Manual Therapy: C/R frog leg (hip add/IR) stretch x5 1'   PATIENT EDUCATION:  Education details: Eval findings, POC, HEP Person educated: Patient Education method: Explanation, Demonstration, Tactile cues, Verbal cues, and Handouts Education comprehension: verbalized understanding, returned demonstration, verbal cues required, tactile cues required, and needs further education     HOME EXERCISE PROGRAM: Access Code: [email or web address] URL: [email or web address] Date: 01/02/2022 Prepared by: Gar Ponto  Exercises - Sit to Stand Without Arm Support  - 1 x daily - 7 x weekly - 3 sets - 10 reps - 3 hold - Heel Raises with Counter Support  - 1 x daily - 7 x weekly - 3 sets - 10 reps - 3 hold - Seated Heel Toe Raises  - 1 x daily - 7 x weekly - 3 sets - 10 reps - 2 hold - Hooklying Clamshell with Resistance  - 1 x daily - 7 x weekly - 3 sets - 10 reps - 3 hold - Standing Gastroc Stretch at Counter  - 1 x daily - 7 x weekly - 1 sets - 3 reps - 30 hold - Seated Piriformis Stretch with Trunk Bend  - 1 x daily - 7 x weekly - 1 sets - 3-5 reps - 30 hold - Standing Hip Abduction with Counter Support  - 1 x daily - 7 x weekly - 3 sets - 10 reps - 3 hold   ASSESSMENT:   CLINICAL IMPRESSION: This PT contacted the AFO representative re: a trail use for bilat drop foot. Pt participated in PT today to increase trunk/LE and hip flexibility and strength. Pt was observed today walking with less scissoring of his legs. Pt estimates a 25% decrease with scissoring of his legs. Pt has long term spine issues requiring 2 surgeries with the most recent in 08/03/21. A slower course of progress is anticipated due to the pt's initial presentation of significant trunk/LE weakness and increased LE muscle tone. Pt is progressing  appropriately toward set PT goals re: LE strength and functional mobility. Pt's gait quality and tolerance have both improved. See LTGs below. Patient is progressing as expected and will continue to benefit from ongoing PT services to address remaining goals and deficits.  OBJECTIVE IMPAIRMENTS Abnormal gait, decreased activity tolerance, decreased balance, difficulty walking, decreased ROM, decreased strength, impaired flexibility, and postural dysfunction.    ACTIVITY LIMITATIONS cleaning, community activity, meal prep, yard work, shopping, and hobbies .    PERSONAL FACTORS Past/current experiences, Time since onset of injury/illness/exacerbation, and 1-2 comorbidities:    arthritis and previous neck surgery around 2000 are also affecting patient's functional outcome.   REHAB POTENTIAL: Good   CLINICAL DECISION MAKING: Evolving/moderate complexity   EVALUATION COMPLEXITY: Moderate     GOALS:   SHORT TERM GOALS: Target date: 12/28/2021   Pt will be Ind in an initial HEP Baseline:started on eval Goal status: MET   LONG TERM GOALS: Target date: 03/15/22   Increase pt's 2 min walking test to 220ft or more as indication of improved function Baseline: 106 c RW; 01/18/22: 2MWT distance 151ft with RW and SBA Goal status: Ongoing   2.  Decreased pt's 5xSTS time to 20" or less s use of hands Baseline: 26.4 sec without use of hands; 01/18/22: 19s using hands on lap Goal status: MET   3.  Increase pt's hip strength to at least 3+/5 and knee strength to at least 4+/5 for improved function  of the LEs 01/18/22: See above flow  sheet    Baseline: See flow sheets Goal status: Improving   4.  Pt will demonstrate improve gait walking with a LRAD, improved gait pattern, and for an increased distance of 447f Baseline: 106 ft c RW; 01/18/22 2026fwith TW and SBA Goal status: Improving   5.  Pt will be Ind in a final HEP to maintain achieved LOF Baseline: started on eval Goal status: Ongoing    PLAN: PT FREQUENCY: 2x/week   PT DURATION: 6 weeks   PLANNED INTERVENTIONS: Therapeutic exercises, Therapeutic activity, Neuromuscular re-education, Balance training, Gait training, Patient/Family education, Joint mobilization, Stair training, Aquatic Therapy, Dry Needling, Electrical stimulation, Cryotherapy, Moist heat, Taping, Ultrasound, Ionotophoresis 23m48ml Dexamethasone, and Manual therapy.   PLAN FOR NEXT SESSION: Continue to address flexibility and strength. Continue assessing LTGs. AFOs?   Shaunte Weissinger MS, PT 01/25/22 1:43 PM   Referring diagnosis? Spinal stenosis of thoracic region Treatment diagnosis? (if different than referring diagnosis)  Difficulty in walking, not elsewhere classified   Muscle weakness (generalized)   Stiffness of left ankle, not elsewhere classified   Stiffness of right ankle, not elsewhere classified What was this (referring dx) caused by? _0  Surgery _1  Fall _2  Ongoing issue _3  Arthritis _4  Other: ____________   Laterality: _5  Rt _6  Lt _7  Both   Check all possible CPT codes:             *CHOOSE 10 OR LESS*                          _8  97110 (Therapeutic Exercise)             _9  92507 (SLP Treatment)  _10  [id number] Re-ed)                           _11  92526 (Swallowing Treatment)             _12  [id number] Training)                           _13  [id number] Training, 1st 15 minutes) _14  97140 (Manual Therapy)                                _15  97130 (Cognitive Training, each add'l 15 minutes)   _16  97164 (Re-evaluation)                              _17  Other, List CPT Code ____________  _18  [id number] Activities)                                    _19  [id number] Care)                      _20  All codes above (97110 - [id number]          _21  97012 (Mechanical Traction)            _22  97014 (E-stim Unattended)            _23  97032 (E-stim manual)            _24  [id number])            [  x] 20037 (Ultrasound)            _0   97760 (Orthotic Fit) _1  [id number] (Physical Performance Training) _2  [id number] (Aquatic Therapy) _3  97034 (Contrast Bath) _4  [id number] (Paraffin) _5  97597 (Wound Care 1st 20 sq cm) _6  97598 (Wound Care each add'l 20 sq cm) _7  97016 (Vasopneumatic Device) _8  94446 (Orthotic Training) _9  [phone] (Prosthetic Training)

## 2022-02-04 ENCOUNTER — Ambulatory Visit: Payer: Medicare HMO | Attending: Neurosurgery

## 2022-02-04 DIAGNOSIS — M25671 Stiffness of right ankle, not elsewhere classified: Secondary | ICD-10-CM | POA: Insufficient documentation

## 2022-02-04 DIAGNOSIS — M25672 Stiffness of left ankle, not elsewhere classified: Secondary | ICD-10-CM | POA: Insufficient documentation

## 2022-02-04 DIAGNOSIS — R262 Difficulty in walking, not elsewhere classified: Secondary | ICD-10-CM | POA: Diagnosis present

## 2022-02-04 DIAGNOSIS — M6281 Muscle weakness (generalized): Secondary | ICD-10-CM | POA: Insufficient documentation

## 2022-02-04 NOTE — Therapy (Signed)
OUTPATIENT PHYSICAL THERAPY TREATMENT NOTE/10th VISIT PROGRESS NOTE/RE-AUTH   Patient Name: Thomas Warner MRN: [id number] DOB:Jun 17, 1961, 61 y.o., male Today's Date: 02/04/2022  PCP: Jolinda Croak, MD REFERRING PROVIDER: Jolinda Croak, MD  See Note below for Objective Data and Assessment of Progress/Goals.   END OF SESSION:   PT End of Session - 02/04/22 0742     Visit Number 12    Number of Visits 23    Date for PT Re-Evaluation 02/15/22    Authorization Type [employer]; [place] Approved 12 PT visits 12/14/2021-01/25/2022    Authorization - Visit Number 10    Authorization - Number of Visits 12    Progress Note Due on Visit 10    PT Start Time 0745    PT Stop Time 0825    PT Time Calculation (min) 40 min    Equipment Utilized During Treatment Other (comment)   RW   Activity Tolerance Patient tolerated treatment well    Behavior During Therapy WFL for tasks assessed/performed                    Past Medical History:  Diagnosis Date   Arthritis    GERD (gastroesophageal reflux disease)    Hypertension    Past Surgical History:  Procedure Laterality Date   LUMBAR LAMINECTOMY/DECOMPRESSION MICRODISCECTOMY N/A 08/03/2021   Procedure: Thoracic Eleven-Twelve  Decompression;  Surgeon: Ashok Pall, MD;  Location: [place];  Service: Neurosurgery;  Laterality: N/A;  3C/RM 20   NECK SURGERY     approx 2000, plate and screws   Patient Active Problem List   Diagnosis Date Noted   Thoracic spinal stenosis 08/03/2021    REFERRING DIAG: spinal stenosis   THERAPY DIAG:  Difficulty in walking, not elsewhere classified  Stiffness of left ankle, not elsewhere classified  Muscle weakness (generalized)  Stiffness of right ankle, not elsewhere classified  PERTINENT HISTORY: Dr. Christella Noa performed a thoracic laminectomy on the patient on 08/03/2021. Cervical fusion 20 yrs ago   PRECAUTIONS: spasticity,  abnormal gait and LE weakness  SUBJECTIVE: No changes to report, awaiting to hear about ankle braces PAIN:  Are you having pain? 5/10 low back, achy in nature, worst with activity and better with rest.   DIAGNOSTIC FINDINGS:    On: 06/23/2021 IMPRESSION: Cervical spine:   1. 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 2. Status post ACDF C3-C5. 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 3. Multilevel facet and uncovertebral hypertrophy, worst at C6-C7, where it is severe bilaterally, and C5-C6, where it is moderate to severe bilaterally.   Thoracic spine:   1. T11-T12 severe spinal canal stenosis and severe bilateral neural foraminal narrowing. Increased T2 signal within the spinal cord at this level without apparent cord expansion, which may represent cord edema. 2. Moderate to severe bilateral neural foraminal narrowing at T9-T10 and T10-T11. 3. Moderate bilateral neural foraminal narrowing at T1-T2, T2-T3, and T3-T4.   Lumbar spine:   1. L2-L3 and L3-L4 moderate to severe thecal sac narrowing, secondary to severe disc height loss, disc osteophyte complexes, disc bulges, and epidural lipomatosis. L2-L3 moderate bilateral neural foraminal narrowing and L3-L4 moderate to severe bilateral neural foraminal narrowing. 2. L4-L5 moderate thecal sac narrowing, due to the aforementioned factors, severe left and moderate to severe right neural foraminal narrowing. 3. L5-S1 severe bilateral neural foraminal  narrowing     PATIENT SURVEYS:  FOTO 56%, 01/08/22= 49%   SCREENING FOR RED FLAGS: Bowel or bladder incontinence: No Spinal tumors: No Cauda equina syndrome: No Compression fracture: No   COGNITION:           Overall cognitive status: Within functional limits for tasks assessed                          SENSATION: Not tested Pt reports the  sensation of walking on bubble wrap on the bottom of his feet.  This is increased after the exercises.    MUSCLE LENGTH: Hamstrings: Right 35 deg; Left 38 deg Tight hip flexors, hip adductors, heel cords bilaterally   POSTURE:  Forward flexed trunk   PALPATION: NT   LE MMT:   MMT Right 12/14/2021 Left 12/14/2021 R 01/18/22 L 01/18/22  Hip flexion 3+ 3 3+ 3+  Hip extension 2 2 3 3   Hip abduction 2 2 3 3   Hip adduction 4 4 4 4   Hip internal rotation 4 4 4 4   Hip external rotation 2 2 3 3   Knee flexion 3 3 3+ 4-  Knee extension 4 4 3+ 3+  Ankle dorsiflexion 2 2 3 3   Ankle plantarflexion 3 3 3 3   Ankle inversion        Ankle eversion         (Blank rows = not tested)   FUNCTIONAL TESTS:  5 times sit to stand: 26.4 s use of hands; 01/18/22 19s 2 minute walk test: 169f; 01/18/22 1147f  GAIT: Distance walked: 106 Assistive device utilized: WaEnvironmental consultant 4 wheeled Level of assistance: Complete Independence Comments: gait deficits include forward flexed trunk, scissor gait, drop foot, genu recurvatum in    stance phase  [facility] Adult PT Treatment:                                                DATE: 02/04/22 Therapeutic Exercise: NuStep 8 mins L6 UE/LE Bridging 3x10 3" Supine marching alt 15/15 Supine hip fallouts 15/15, focus on control Heel slides (over towel) 15/15, focus on control SL hip abd 15x B Clamshell 15x B Seated STS 10x from airex w/UE support Bilat DF stretch at counter x3 20"  [facility] Adult PT Treatment:                                                DATE: 01/25/22 Therapeutic Exercise: NuStep 76m61m L6 UE/LE Bridging 3x10 3" Supine hip clams 2x15 BluTB SL hip abd 3x10  Seated STS 2x10 Lateral steps 62f63fat counter Bilat DF stretch at counter x3 20"   [facility]                                                DATE: 01/18/22 Therapeutic Exercise: Seated hamstring/adductor stretch 30s x2  Seated ankle DF 30x on RB Side stepping at counter, wide  steps, x12 62ft90f fwd, 6x bwd  Re-eval   [facility] Adult PT Treatment:  DATE: 01/10/22 Therapeutic Exercise: Nustep 5 mins L6 LE Seated frog leg stretch 3x 30" Standing hip add stretch 3x 30" Seated ankle DF 2x15 STS mat table, ball between knees 2x10, more difficult without knee touching Quadruped Alt arm lifts x10 each. Mule kicks x4 each Side stepping at counter, wide steps, x12 81f Standing hip abd 3# 3x10 Standing hip ext 5# 3x10 Manual Therapy: C/R frog leg (hip add/IR) stretch x5 1'   PATIENT EDUCATION:  Education details: Eval findings, POC, HEP Person educated: Patient Education method: Explanation, Demonstration, Tactile cues, Verbal cues, and Handouts Education comprehension: verbalized understanding, returned demonstration, verbal cues required, tactile cues required, and needs further education     HOME EXERCISE PROGRAM: Access Code: [email or web address] URL: [email or web address] Date: 01/02/2022 Prepared by: AGar Ponto Exercises - Sit to Stand Without Arm Support  - 1 x daily - 7 x weekly - 3 sets - 10 reps - 3 hold - Heel Raises with Counter Support  - 1 x daily - 7 x weekly - 3 sets - 10 reps - 3 hold - Seated Heel Toe Raises  - 1 x daily - 7 x weekly - 3 sets - 10 reps - 2 hold - Hooklying Clamshell with Resistance  - 1 x daily - 7 x weekly - 3 sets - 10 reps - 3 hold - Standing Gastroc Stretch at Counter  - 1 x daily - 7 x weekly - 1 sets - 3 reps - 30 hold - Seated Piriformis Stretch with Trunk Bend  - 1 x daily - 7 x weekly - 1 sets - 3-5 reps - 30 hold - Standing Hip Abduction with Counter Support  - 1 x daily - 7 x weekly - 3 sets - 10 reps - 3 hold   ASSESSMENT:   CLINICAL IMPRESSION: Increased time on Aerobic work today vs resistance.  LE tone limits function and mobility altering his gait pattern and causing patient to expend a greater amount of energy to mobilize. Todays session focused on tasks and  activities to counteract tone and restore postural balance   OBJECTIVE IMPAIRMENTS Abnormal gait, decreased activity tolerance, decreased balance, difficulty walking, decreased ROM, decreased strength, impaired flexibility, and postural dysfunction.    ACTIVITY LIMITATIONS cleaning, community activity, meal prep, yard work, shopping, and hobbies .    PERSONAL FACTORS Past/current experiences, Time since onset of injury/illness/exacerbation, and 1-2 comorbidities:    arthritis and previous neck surgery around 2000 are also affecting patient's functional outcome.   REHAB POTENTIAL: Good   CLINICAL DECISION MAKING: Evolving/moderate complexity   EVALUATION COMPLEXITY: Moderate     GOALS:   SHORT TERM GOALS: Target date: 12/28/2021   Pt will be Ind in an initial HEP Baseline:started on eval Goal status: MET   LONG TERM GOALS: Target date: 03/15/22   Increase pt's 2 min walking test to 2571for more as indication of improved function Baseline: 106 c RW; 01/18/22: 2MWT distance 11250fith RW and SBA Goal status: Ongoing   2.  Decreased pt's 5xSTS time to 20" or less s use of hands Baseline: 26.4 sec without use of hands; 01/18/22: 19s using hands on lap Goal status: MET   3.  Increase pt's hip strength to at least 3+/5 and knee strength to at least 4+/5 for improved function of the LEs 01/18/22: See above flow  sheet    Baseline: See flow sheets Goal status: Improving   4.  Pt will demonstrate improve gait walking with a LRAD, improved gait  pattern, and for an increased distance of 418f Baseline: 106 ft c RW; 01/18/22 2053fwith TW and SBA Goal status: Improving   5.  Pt will be Ind in a final HEP to maintain achieved LOF Baseline: started on eval Goal status: Ongoing   PLAN: PT FREQUENCY: 2x/week   PT DURATION: 6 weeks   PLANNED INTERVENTIONS: Therapeutic exercises, Therapeutic activity, Neuromuscular re-education, Balance training, Gait training, Patient/Family education,  Joint mobilization, Stair training, Aquatic Therapy, Dry Needling, Electrical stimulation, Cryotherapy, Moist heat, Taping, Ultrasound, Ionotophoresis 43m10ml Dexamethasone, and Manual therapy.   PLAN FOR NEXT SESSION: Continue to address flexibility and strength. Continue assessing LTGs. AFOs?   JefLeroy Sea

## 2022-02-05 NOTE — Therapy (Signed)
OUTPATIENT PHYSICAL THERAPY TREATMENT NOTE/10th VISIT PROGRESS NOTE/RE-AUTH   Patient Name: Thomas Warner MRN: [id number] DOB:15-Oct-1960, 61 y.o., male Today's Date: 02/06/2022  PCP: Jolinda Croak, MD REFERRING PROVIDER: Jolinda Croak, MD  See Note below for Objective Data and Assessment of Progress/Goals.   END OF SESSION:   PT End of Session - 02/06/22 1029     Visit Number 13    Number of Visits 23    Date for PT Re-Evaluation 02/15/22    Authorization Time Period 12 PT visits 01/29/2022-02/13/2022    Progress Note Due on Visit 10    PT Start Time 1020    PT Stop Time 1105    PT Time Calculation (min) 45 min    Equipment Utilized During Treatment Other (comment)   RW   Activity Tolerance Patient tolerated treatment well    Behavior During Therapy WFL for tasks assessed/performed                     Past Medical History:  Diagnosis Date   Arthritis    GERD (gastroesophageal reflux disease)    Hypertension    Past Surgical History:  Procedure Laterality Date   LUMBAR LAMINECTOMY/DECOMPRESSION MICRODISCECTOMY N/A 08/03/2021   Procedure: Thoracic Eleven-Twelve  Decompression;  Surgeon: Ashok Pall, MD;  Location: [place];  Service: Neurosurgery;  Laterality: N/A;  3C/RM 20   NECK SURGERY     approx 2000, plate and screws   Patient Active Problem List   Diagnosis Date Noted   Thoracic spinal stenosis 08/03/2021    REFERRING DIAG: spinal stenosis   THERAPY DIAG:  Difficulty in walking, not elsewhere classified  Stiffness of left ankle, not elsewhere classified  Muscle weakness (generalized)  Stiffness of right ankle, not elsewhere classified  PERTINENT HISTORY: Dr. Christella Noa performed a thoracic laminectomy on the patient on 08/03/2021. Cervical fusion 20 yrs ago   PRECAUTIONS: spasticity, abnormal gait and LE weakness  SUBJECTIVE: Waiting on hearing from AFO rep.   PAIN:  Are you having pain? 2/10 low back, achy in nature, worst  with activity and better with rest.   DIAGNOSTIC FINDINGS:    On: 06/23/2021 IMPRESSION: Cervical spine:   1. 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 2. Status post ACDF C3-C5. 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 3. Multilevel facet and uncovertebral hypertrophy, worst at C6-C7, where it is severe bilaterally, and C5-C6, where it is moderate to severe bilaterally.   Thoracic spine:   1. T11-T12 severe spinal canal stenosis and severe bilateral neural foraminal narrowing. Increased T2 signal within the spinal cord at this level without apparent cord expansion, which may represent cord edema. 2. Moderate to severe bilateral neural foraminal narrowing at T9-T10 and T10-T11. 3. Moderate bilateral neural foraminal narrowing at T1-T2, T2-T3, and T3-T4.   Lumbar spine:   1. L2-L3 and L3-L4 moderate to severe thecal sac narrowing, secondary to severe disc height loss, disc osteophyte complexes, disc bulges, and epidural lipomatosis. L2-L3 moderate bilateral neural foraminal narrowing and L3-L4 moderate to severe bilateral neural foraminal narrowing. 2. L4-L5 moderate thecal sac narrowing, due to the aforementioned factors, severe left and moderate to severe right neural foraminal narrowing. 3. L5-S1 severe bilateral neural foraminal narrowing     PATIENT SURVEYS:  FOTO 56%, 01/08/22= 49%   SCREENING FOR RED FLAGS: Bowel or bladder incontinence: No Spinal tumors: No Cauda equina syndrome: No Compression  fracture: No   COGNITION:           Overall cognitive status: Within functional limits for tasks assessed                          SENSATION: Not tested Pt reports the sensation of walking on bubble wrap on the bottom of his feet.  This is increased after the exercises.    MUSCLE LENGTH: Hamstrings: Right 35 deg; Left 38 deg Tight hip  flexors, hip adductors, heel cords bilaterally   POSTURE:  Forward flexed trunk   PALPATION: NT   LE MMT:   MMT Right 12/14/2021 Left 12/14/2021 R 01/18/22 L 01/18/22  Hip flexion 3+ 3 3+ 3+  Hip extension _0 Hip abduction _1 Hip adduction _2 Hip internal rotation _3 Hip external rotation _4 Knee flexion 3 3 3+ 4-  Knee extension 4 4 3+ 3+  Ankle dorsiflexion _5 Ankle plantarflexion _6 Ankle inversion        Ankle eversion         (Blank rows = not tested)   FUNCTIONAL TESTS:  5 times sit to stand: 26.4 s use of hands; 01/18/22 19s 2 minute walk test: 127f; 01/18/22 1127f  GAIT: Distance walked: 106 Assistive device utilized: WaEnvironmental consultant 4 wheeled Level of assistance: Complete Independence Comments: gait deficits include forward flexed trunk, scissor gait, drop foot, genu recurvatum in    stance phase  [facility] Adult PT Treatment:                                                DATE: 02/06/22 Therapeutic Exercise: Nustep 6 mins L6 UE/LE Banded hip GTB abd at counter 4x12x2 Lateral step ups 4" 2x10 at counter use of hands as needed Forward step ups 4" 2x10 at counter use of hands as needed Omega leg presses 3x10 45# Manual Therapy: PT assisted hip add and IR stertchs x5, 20"    [facility] Adult PT Treatment:                                                DATE: 02/04/22 Therapeutic Exercise: NuStep 8 mins L6 UE/LE Bridging 3x10 3" Supine marching alt 15/15 Supine hip fallouts 15/15, focus on control Heel slides (over towel) 15/15, focus on control SL hip abd 15x B Clamshell 15x B Seated STS 10x from airex w/UE support Bilat DF stretch at counter x3 20"  PATIENT EDUCATION:  Education details: Eval findings, POC, HEP Person educated: Patient Education method: Explanation, Demonstration, Tactile cues, Verbal cues, and Handouts Education comprehension: verbalized understanding, returned demonstration, verbal cues required, tactile cues  required, and needs further education     HOME EXERCISE PROGRAM: Access Code: [email or web address] URL: [email or web address] Date: 01/02/2022 Prepared by: AlGar PontoExercises - Sit to Stand Without Arm Support  - 1 x daily - 7 x weekly - 3 sets - 10 reps - 3 hold - Heel Raises with Counter Support  - 1 x daily - 7 x weekly - 3 sets - 10 reps -  3 hold - Seated Heel Toe Raises  - 1 x daily - 7 x weekly - 3 sets - 10 reps - 2 hold - Hooklying Clamshell with Resistance  - 1 x daily - 7 x weekly - 3 sets - 10 reps - 3 hold - Standing Gastroc Stretch at Counter  - 1 x daily - 7 x weekly - 1 sets - 3 reps - 30 hold - Seated Piriformis Stretch with Trunk Bend  - 1 x daily - 7 x weekly - 1 sets - 3-5 reps - 30 hold - Standing Hip Abduction with Counter Support  - 1 x daily - 7 x weekly - 3 sets - 10 reps - 3 hold   ASSESSMENT:   CLINICAL IMPRESSION: PT was completed for Hip add/IR flexibility and LE strengthening especially for the hip abd, ER, ext and knee ext. Pt tolerated the session without adverse effects. Pt continues to consistently demonstrate an improved gait pattern c decreased incidents of scissoring of the legs. Awaiting set up with AFOs for additional assistance with gait impacted by tone and weakness. . OBJECTIVE IMPAIRMENTS Abnormal gait, decreased activity tolerance, decreased balance, difficulty walking, decreased ROM, decreased strength, impaired flexibility, and postural dysfunction.    ACTIVITY LIMITATIONS cleaning, community activity, meal prep, yard work, shopping, and hobbies .    PERSONAL FACTORS Past/current experiences, Time since onset of injury/illness/exacerbation, and 1-2 comorbidities:    arthritis and previous neck surgery around 2000 are also affecting patient's functional outcome.   REHAB POTENTIAL: Good   CLINICAL DECISION MAKING: Evolving/moderate complexity   EVALUATION COMPLEXITY: Moderate     GOALS:   SHORT TERM GOALS: Target date: 12/28/2021    Pt will be Ind in an initial HEP Baseline:started on eval Goal status: MET   LONG TERM GOALS: Target date: 03/15/22   Increase pt's 2 min walking test to 235f or more as indication of improved function Baseline: 106 c RW; 01/18/22: 2MWT distance 1189fwith RW and SBA Goal status: Ongoing   2.  Decreased pt's 5xSTS time to 20" or less s use of hands Baseline: 26.4 sec without use of hands; 01/18/22: 19s using hands on lap Goal status: MET   3.  Increase pt's hip strength to at least 3+/5 and knee strength to at least 4+/5 for improved function of the LEs 01/18/22: See above flow  sheet    Baseline: See flow sheets Goal status: Improving   4.  Pt will demonstrate improve gait walking with a LRAD, improved gait pattern, and for an increased distance of 40077faseline: 106 ft c RW; 01/18/22 200f60fth TW and SBA Goal status: Improving   5.  Pt will be Ind in a final HEP to maintain achieved LOF Baseline: started on eval Goal status: Ongoing   PLAN: PT FREQUENCY: 2x/week   PT DURATION: 6 weeks   PLANNED INTERVENTIONS: Therapeutic exercises, Therapeutic activity, Neuromuscular re-education, Balance training, Gait training, Patient/Family education, Joint mobilization, Stair training, Aquatic Therapy, Dry Needling, Electrical stimulation, Cryotherapy, Moist heat, Taping, Ultrasound, Ionotophoresis 4mg/79mDexamethasone, and Manual therapy.   PLAN FOR NEXT SESSION: Continue to address flexibility and strength. Continue assessing LTGs. AFOs?   Milika Ventress MS, PT 02/06/22 10:46 PM

## 2022-02-06 ENCOUNTER — Ambulatory Visit: Payer: Medicare HMO

## 2022-02-06 DIAGNOSIS — R262 Difficulty in walking, not elsewhere classified: Secondary | ICD-10-CM | POA: Diagnosis not present

## 2022-02-06 DIAGNOSIS — M25672 Stiffness of left ankle, not elsewhere classified: Secondary | ICD-10-CM

## 2022-02-06 DIAGNOSIS — M6281 Muscle weakness (generalized): Secondary | ICD-10-CM

## 2022-02-06 DIAGNOSIS — M25671 Stiffness of right ankle, not elsewhere classified: Secondary | ICD-10-CM

## 2022-02-11 ENCOUNTER — Ambulatory Visit: Payer: Medicare HMO

## 2022-02-11 DIAGNOSIS — M25672 Stiffness of left ankle, not elsewhere classified: Secondary | ICD-10-CM

## 2022-02-11 DIAGNOSIS — R262 Difficulty in walking, not elsewhere classified: Secondary | ICD-10-CM | POA: Diagnosis not present

## 2022-02-11 DIAGNOSIS — M6281 Muscle weakness (generalized): Secondary | ICD-10-CM

## 2022-02-11 DIAGNOSIS — M25671 Stiffness of right ankle, not elsewhere classified: Secondary | ICD-10-CM

## 2022-02-11 NOTE — Therapy (Signed)
OUTPATIENT PHYSICAL THERAPY TREATMENT NOTE/10th VISIT PROGRESS NOTE/RE-AUTH   Patient Name: Thomas Warner MRN: [id number] DOB:Dec 14, 1960, 61 y.o., male Today's Date: 02/11/2022  PCP: Jolinda Croak, MD REFERRING PROVIDER: Jolinda Croak, MD  See Note below for Objective Data and Assessment of Progress/Goals.   END OF SESSION:   PT End of Session - 02/11/22 0833     Visit Number 14    Number of Visits 23    Date for PT Re-Evaluation 02/15/22    Authorization Time Period 12 PT visits 01/29/2022-02/13/2022    Progress Note Due on Visit 10    PT Start Time 0830    PT Stop Time 0910    PT Time Calculation (min) 40 min    Equipment Utilized During Treatment Other (comment)   RW   Activity Tolerance Patient tolerated treatment well    Behavior During Therapy WFL for tasks assessed/performed                     Past Medical History:  Diagnosis Date   Arthritis    GERD (gastroesophageal reflux disease)    Hypertension    Past Surgical History:  Procedure Laterality Date   LUMBAR LAMINECTOMY/DECOMPRESSION MICRODISCECTOMY N/A 08/03/2021   Procedure: Thoracic Eleven-Twelve  Decompression;  Surgeon: Ashok Pall, MD;  Location: [facility];  Service: Neurosurgery;  Laterality: N/A;  3C/RM 20   NECK SURGERY     approx 2000, plate and screws   Patient Active Problem List   Diagnosis Date Noted   Thoracic spinal stenosis 08/03/2021    REFERRING DIAG: spinal stenosis   THERAPY DIAG:  Difficulty in walking, not elsewhere classified  Stiffness of left ankle, not elsewhere classified  Stiffness of right ankle, not elsewhere classified  Muscle weakness (generalized)  PERTINENT HISTORY: Dr. Christella Noa performed a thoracic laminectomy on the patient on 08/03/2021. Cervical fusion 20 yrs ago   PRECAUTIONS: spasticity, abnormal gait and LE weakness  SUBJECTIVE: Patient frustrated and voicing concerns about braces.  Has not heard from rep regarding ankle braces and  is considering additional options regarding braces/ankle supports.  Feels therapy is not helping at this time and he needs something to control the spasticity in his feet/ankles to benefit his mobility and relieve his back pain  PAIN:  Are you having pain? 2/10 low back, achy in nature, worst with activity and better with rest.   DIAGNOSTIC FINDINGS:    On: 06/23/2021 IMPRESSION: Cervical spine:   1. 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 2. Status post ACDF C3-C5. 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 3. Multilevel facet and uncovertebral hypertrophy, worst at C6-C7, where it is severe bilaterally, and C5-C6, where it is moderate to severe bilaterally.   Thoracic spine:   1. T11-T12 severe spinal canal stenosis and severe bilateral neural foraminal narrowing. Increased T2 signal within the spinal cord at this level without apparent cord expansion, which may represent cord edema. 2. Moderate to severe bilateral neural foraminal narrowing at T9-T10 and T10-T11. 3. Moderate bilateral neural foraminal narrowing at T1-T2, T2-T3, and T3-T4.   Lumbar spine:   1. L2-L3 and L3-L4 moderate to severe thecal sac narrowing, secondary to severe disc height loss, disc osteophyte complexes, disc bulges, and epidural lipomatosis. L2-L3 moderate bilateral neural foraminal narrowing and L3-L4 moderate to severe bilateral neural foraminal narrowing. 2. L4-L5 moderate thecal sac narrowing, due to the aforementioned factors, severe  left and moderate to severe right neural foraminal narrowing. 3. L5-S1 severe bilateral neural foraminal narrowing     PATIENT SURVEYS:  FOTO 56%, 01/08/22= 49%   SCREENING FOR RED FLAGS: Bowel or bladder incontinence: No Spinal tumors: No Cauda equina syndrome: No Compression fracture: No   COGNITION:           Overall  cognitive status: Within functional limits for tasks assessed                          SENSATION: Not tested Pt reports the sensation of walking on bubble wrap on the bottom of his feet.  This is increased after the exercises.    MUSCLE LENGTH: Hamstrings: Right 35 deg; Left 38 deg Tight hip flexors, hip adductors, heel cords bilaterally   POSTURE:  Forward flexed trunk   PALPATION: NT   LE MMT:   MMT Right 12/14/2021 Left 12/14/2021 R 01/18/22 L 01/18/22  Hip flexion 3+ 3 3+ 3+  Hip extension 2 2 3 3   Hip abduction 2 2 3 3   Hip adduction 4 4 4 4   Hip internal rotation 4 4 4 4   Hip external rotation 2 2 3 3   Knee flexion 3 3 3+ 4-  Knee extension 4 4 3+ 3+  Ankle dorsiflexion 2 2 3 3   Ankle plantarflexion 3 3 3 3   Ankle inversion        Ankle eversion         (Blank rows = not tested)   FUNCTIONAL TESTS:  5 times sit to stand: 26.4 s use of hands; 01/18/22 19s 2 minute walk test: 112f; 01/18/22 1182f  GAIT: Distance walked: 106 Assistive device utilized: WaEnvironmental consultant 4 wheeled Level of assistance: Complete Independence Comments: gait deficits include forward flexed trunk, scissor gait, drop foot, genu recurvatum in    stance phase [facility] Adult PT Treatment:                                                DATE: 02/11/22 Therapeutic Exercise: NuStep 8 mins L4 UE/LE SKTC B 30s x2 Supine marching alt 15/15 Supine hip fallouts 15/15, focus on control LTR over ball 15/15 DKTC 15x FAQs 15/15 alternating Seated RB PF/DF 60s  [facility] Adult PT Treatment:                                                DATE: 02/06/22 Therapeutic Exercise: Nustep 6 mins L6 UE/LE Banded hip GTB abd at counter 4x12x2 Lateral step ups 4" 2x10 at counter use of hands as needed Forward step ups 4" 2x10 at counter use of hands as needed Omega leg presses 3x10 45# Manual Therapy: PT assisted hip add and IR stertchs x5, 20"    [facility] Adult PT Treatment:                                                DATE:  02/04/22 Therapeutic Exercise: NuStep 8 mins L6 UE/LE Bridging 3x10 3" Supine marching alt 15/15 Supine hip fallouts 15/15, focus on control Heel  slides (over towel) 15/15, focus on control SL hip abd 15x B Clamshell 15x B Seated STS 10x from airex w/UE support Bilat DF stretch at counter x3 20"  PATIENT EDUCATION:  Education details: Eval findings, POC, HEP Person educated: Patient Education method: Explanation, Demonstration, Tactile cues, Verbal cues, and Handouts Education comprehension: verbalized understanding, returned demonstration, verbal cues required, tactile cues required, and needs further education     HOME EXERCISE PROGRAM: Access Code: [email or web address] URL: [email or web address] Date: 01/02/2022 Prepared by: Gar Ponto  Exercises - Sit to Stand Without Arm Support  - 1 x daily - 7 x weekly - 3 sets - 10 reps - 3 hold - Heel Raises with Counter Support  - 1 x daily - 7 x weekly - 3 sets - 10 reps - 3 hold - Seated Heel Toe Raises  - 1 x daily - 7 x weekly - 3 sets - 10 reps - 2 hold - Hooklying Clamshell with Resistance  - 1 x daily - 7 x weekly - 3 sets - 10 reps - 3 hold - Standing Gastroc Stretch at Counter  - 1 x daily - 7 x weekly - 1 sets - 3 reps - 30 hold - Seated Piriformis Stretch with Trunk Bend  - 1 x daily - 7 x weekly - 1 sets - 3-5 reps - 30 hold - Standing Hip Abduction with Counter Support  - 1 x daily - 7 x weekly - 3 sets - 10 reps - 3 hold   ASSESSMENT:   CLINICAL IMPRESSION: Patient main concern is obtaining AFOs to help his gait and mobility.  Current spasticity is affecting his low back pain and today's session was spent trying to relieve low back pain/discomfort.  Increased scissoring noted with ambulation today and patient reporting he has good days/bad days, today being a bad day with increased tone. . OBJECTIVE IMPAIRMENTS Abnormal gait, decreased activity tolerance, decreased balance, difficulty walking, decreased ROM, decreased  strength, impaired flexibility, and postural dysfunction.    ACTIVITY LIMITATIONS cleaning, community activity, meal prep, yard work, shopping, and hobbies .    PERSONAL FACTORS Past/current experiences, Time since onset of injury/illness/exacerbation, and 1-2 comorbidities:    arthritis and previous neck surgery around 2000 are also affecting patient's functional outcome.   REHAB POTENTIAL: Good   CLINICAL DECISION MAKING: Evolving/moderate complexity   EVALUATION COMPLEXITY: Moderate     GOALS:   SHORT TERM GOALS: Target date: 12/28/2021   Pt will be Ind in an initial HEP Baseline:started on eval Goal status: MET   LONG TERM GOALS: Target date: 03/15/22   Increase pt's 2 min walking test to 285f or more as indication of improved function Baseline: 106 c RW; 01/18/22: 2MWT distance 1166fwith RW and SBA Goal status: Ongoing   2.  Decreased pt's 5xSTS time to 20" or less s use of hands Baseline: 26.4 sec without use of hands; 01/18/22: 19s using hands on lap Goal status: MET   3.  Increase pt's hip strength to at least 3+/5 and knee strength to at least 4+/5 for improved function of the LEs 01/18/22: See above flow  sheet    Baseline: See flow sheets Goal status: Improving   4.  Pt will demonstrate improve gait walking with a LRAD, improved gait pattern, and for an increased distance of 40016faseline: 106 ft c RW; 01/18/22 200f86fth TW and SBA Goal status: Improving   5.  Pt will be Ind in a final HEP to maintain achieved LOF  Baseline: started on eval Goal status: Ongoing   PLAN: PT FREQUENCY: 2x/week   PT DURATION: 6 weeks   PLANNED INTERVENTIONS: Therapeutic exercises, Therapeutic activity, Neuromuscular re-education, Balance training, Gait training, Patient/Family education, Joint mobilization, Stair training, Aquatic Therapy, Dry Needling, Electrical stimulation, Cryotherapy, Moist heat, Taping, Ultrasound, Ionotophoresis 82m/ml Dexamethasone, and Manual therapy.    PLAN FOR NEXT SESSION: Continue to address flexibility and strength. Continue assessing LTGs. Follow up on patient concerns over AFOs?   JLeroy SeaPT   02/11/22 8:37 AM

## 2022-02-12 NOTE — Therapy (Addendum)
OUTPATIENT PHYSICAL THERAPY TREATMENT NOTE/10th VISIT PROGRESS NOTE/RE-AUTH   Patient Name: Thomas Warner MRN: [id number] DOB:01/30/1961, 61 y.o., male Today's Date: 02/13/2022  PCP: Manfredi, Brenda L, MD REFERRING PROVIDER: Manfredi, Brenda L, MD  See Note below for Objective Data and Assessment of Progress/Goals.   END OF SESSION:   PT End of Session - 02/13/22 0921     Visit Number 15    Number of Visits 23    Date for PT Re-Evaluation 03/15/22    Authorization Type [employer]IC[employer] HMO; MEDICAID OF [place]    Authorization Time Period 12 PT visits 01/29/2022-03/15/2022    Authorization - Visit Number 13    Authorization - Number of Visits 22    Progress Note Due on Visit 10    PT Start Time 0915    PT Stop Time 1000    PT Time Calculation (min) 45 min    Equipment Utilized During Treatment Other (comment)   RW   Activity Tolerance Patient tolerated treatment well    Behavior During Therapy WFL for tasks assessed/performed                      Past Medical History:  Diagnosis Date   Arthritis    GERD (gastroesophageal reflux disease)    Hypertension    Past Surgical History:  Procedure Laterality Date   LUMBAR LAMINECTOMY/DECOMPRESSION MICRODISCECTOMY N/A 08/03/2021   Procedure: Thoracic Eleven-Twelve  Decompression;  Surgeon: Cabbell, Kyle, MD;  Location: [facility];  Service: Neurosurgery;  Laterality: N/A;  3C/RM 20   NECK SURGERY     approx 2000, plate and screws   Patient Active Problem List   Diagnosis Date Noted   Thoracic spinal stenosis 08/03/2021    REFERRING DIAG: spinal stenosis   THERAPY DIAG:  Difficulty in walking, not elsewhere classified - Plan: PT plan of care cert/re-cert  Stiffness of left ankle, not elsewhere classified - Plan: PT plan of care cert/re-cert  Stiffness of right ankle, not elsewhere classified - Plan: PT plan of care cert/re-cert  Muscle weakness (generalized) - Plan: PT plan of care cert/re-cert  PERTINENT  HISTORY: Dr. Cabbell performed a thoracic laminectomy on the patient on 08/03/2021. Cervical fusion 20 yrs ago   PRECAUTIONS: spasticity, abnormal gait and LE weakness  SUBJECTIVE: Pt offered no new concerns.  PAIN:  Are you having pain? 3/10 low back, achy in nature, worst with activity and better with rest.   DIAGNOSTIC FINDINGS:    On: 06/23/2021 IMPRESSION: Cervical spine:   1. 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 2. Status post ACDF C3-C5. 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 3. Multilevel facet and uncovertebral hypertrophy, worst at C6-C7, where it is severe bilaterally, and C5-C6, where it is moderate to severe bilaterally.   Thoracic spine:   1. T11-T12 severe spinal canal stenosis and severe bilateral neural foraminal narrowing. Increased T2 signal within the spinal cord at this level without apparent cord expansion, which may represent cord edema. 2. Moderate to severe bilateral neural foraminal narrowing at T9-T10 and T10-T11. 3. Moderate bilateral neural foraminal narrowing at T1-T2, T2-T3, and T3-T4.   Lumbar spine:   1. L2-L3 and L3-L4 moderate to severe thecal sac narrowing, secondary to severe disc height loss, disc osteophyte complexes, disc bulges, and epidural lipomatosis. L2-L3 moderate bilateral neural foraminal narrowing and L3-L4 moderate to severe bilateral neural foraminal narrowing. 2. L4-L5 moderate   thecal sac narrowing, due to the aforementioned factors, severe left and moderate to severe right neural foraminal narrowing. 3. L5-S1 severe bilateral neural foraminal narrowing     PATIENT SURVEYS:  FOTO 56%, 01/08/22= 49%   SCREENING FOR RED FLAGS: Bowel or bladder incontinence: No Spinal tumors: No Cauda equina syndrome: No Compression fracture: No   COGNITION:           Overall cognitive status:  Within functional limits for tasks assessed                          SENSATION: Not tested Pt reports the sensation of walking on bubble wrap on the bottom of his feet.  This is increased after the exercises.    MUSCLE LENGTH: Hamstrings: Right 35 deg; Left 38 deg Tight hip flexors, hip adductors, heel cords bilaterally   POSTURE:  Forward flexed trunk   PALPATION: NT   LE MMT:   MMT Right 12/14/2021 Left 12/14/2021 R 01/18/22 L 01/18/22  Hip flexion 3+ 3 3+ 3+  Hip extension 2 2 3 3  Hip abduction 2 2 3 3  Hip adduction 4 4 4 4  Hip internal rotation 4 4 4 4  Hip external rotation 2 2 3 3  Knee flexion 3 3 3+ 4-  Knee extension 4 4 3+ 3+  Ankle dorsiflexion 2 2 3 3  Ankle plantarflexion 3 3 3 3  Ankle inversion        Ankle eversion         (Blank rows = not tested)   FUNCTIONAL TESTS:  5 times sit to stand: 26.4 s use of hands; 01/18/22 19s 2 minute walk test: 106ft; 01/18/22 112ft   GAIT: Distance walked: 106 Assistive device utilized: Walker - 4 wheeled Level of assistance: Complete Independence Comments: gait deficits include forward flexed trunk, scissor gait, drop foot, genu recurvatum in    stance phase  [facility] Adult PT Treatment:                                                DATE: 02/13/22 Therapeutic Exercise: NuStep 8 mins L6 LE Hinged hip lifting  c 15# KB from 4" box 2x10 Banded side steps at counter 2x8 GTB Seated hamstring curls RTB 2x15 Manual Therapy: PT assisted hip add and IR stertches x5, 20"  [facility] Adult PT Treatment:                                                DATE: 02/11/22 Therapeutic Exercise: NuStep 8 mins L4 UE/LE SKTC B 30s x2 Supine marching alt 15/15 Supine hip fallouts 15/15, focus on control LTR over ball 15/15 DKTC 15x FAQs 15/15 alternating Seated RB PF/DF 60s  [facility] Adult PT Treatment:                                                DATE: 02/06/22 Therapeutic Exercise: Nustep 6 mins L6 UE/LE Banded hip GTB abd at counter  4x12x2 Lateral step ups 4" 2x10 at counter use of hands as needed Forward   step ups 4" 2x10 at counter use of hands as needed Omega leg presses 3x10 45# Manual Therapy: PT assisted hip add and IR stertchs x5, 20"  PATIENT EDUCATION:  Education details: Eval findings, POC, HEP Person educated: Patient Education method: Explanation, Demonstration, Tactile cues, Verbal cues, and Handouts Education comprehension: verbalized understanding, returned demonstration, verbal cues required, tactile cues required, and needs further education     HOME EXERCISE PROGRAM: Access Code: [email or web address] URL: [email or web address] Date: 01/02/2022 Prepared by: Draiden Mirsky  Exercises - Sit to Stand Without Arm Support  - 1 x daily - 7 x weekly - 3 sets - 10 reps - 3 hold - Heel Raises with Counter Support  - 1 x daily - 7 x weekly - 3 sets - 10 reps - 3 hold - Seated Heel Toe Raises  - 1 x daily - 7 x weekly - 3 sets - 10 reps - 2 hold - Hooklying Clamshell with Resistance  - 1 x daily - 7 x weekly - 3 sets - 10 reps - 3 hold - Standing Gastroc Stretch at Counter  - 1 x daily - 7 x weekly - 1 sets - 3 reps - 30 hold - Seated Piriformis Stretch with Trunk Bend  - 1 x daily - 7 x weekly - 1 sets - 3-5 reps - 30 hold - Standing Hip Abduction with Counter Support  - 1 x daily - 7 x weekly - 3 sets - 10 reps - 3 hold   ASSESSMENT:   CLINICAL IMPRESSION: Provided pt with contact info for AFO rep.  PT was completed to address hip IR and add tone/tightness and strength of the hip extensors, hip abductors and hamstrings. Scissoring was improved today, Focus on hamstring strengthening is to address the knee recurvatum in stance phase of gait, R>L. Pt will continue to benefit from skilled PT to improve deficits to optimize functional mobility. . OBJECTIVE IMPAIRMENTS Abnormal gait, decreased activity tolerance, decreased balance, difficulty walking, decreased ROM, decreased strength, impaired flexibility,  and postural dysfunction.    ACTIVITY LIMITATIONS cleaning, community activity, meal prep, yard work, shopping, and hobbies .    PERSONAL FACTORS Past/current experiences, Time since onset of injury/illness/exacerbation, and 1-2 comorbidities:    arthritis and previous neck surgery around 2000 are also affecting patient's functional outcome.   REHAB POTENTIAL: Good   CLINICAL DECISION MAKING: Evolving/moderate complexity   EVALUATION COMPLEXITY: Moderate     GOALS:   SHORT TERM GOALS: Target date: 12/28/2021   Pt will be Ind in an initial HEP Baseline:started on eval Goal status: MET   LONG TERM GOALS: Target date: 03/15/22   Increase pt's 2 min walking test to 250ft or more as indication of improved function Baseline: 106 c RW; 01/18/22: 2MWT distance 112ft with RW and SBA Goal status: Ongoing   2.  Decreased pt's 5xSTS time to 20" or less s use of hands Baseline: 26.4 sec without use of hands; 01/18/22: 19s using hands on lap Goal status: MET   3.  Increase pt's hip strength to at least 3+/5 and knee strength to at least 4+/5 for improved function of the LEs 01/18/22: See above flow  sheet    Baseline: See flow sheets Goal status: Improving   4.  Pt will demonstrate improve gait walking with a LRAD, improved gait pattern, and for an increased distance of 400ft Baseline: 106 ft c RW; 01/18/22 200ft with TW and SBA Goal status: Improving   5.  Pt will be Ind in   a final HEP to maintain achieved LOF Baseline: started on eval Goal status: Ongoing   PLAN: PT FREQUENCY: 2x/week   PT DURATION: 4 weeks   PLANNED INTERVENTIONS: Therapeutic exercises, Therapeutic activity, Neuromuscular re-education, Balance training, Gait training, Patient/Family education, Joint mobilization, Stair training, Aquatic Therapy, Dry Needling, Electrical stimulation, Cryotherapy, Moist heat, Taping, Ultrasound, Ionotophoresis 4mg/ml Dexamethasone, and Manual therapy.   PLAN FOR NEXT SESSION: Continue  to address flexibility and strength. Continue assessing LTGs. Follow up on patient concerns over AFOs?  Kingsten Enfield MS, PT 02/13/22 6:05 PM       

## 2022-02-13 ENCOUNTER — Ambulatory Visit: Payer: Medicare HMO

## 2022-02-13 DIAGNOSIS — M25672 Stiffness of left ankle, not elsewhere classified: Secondary | ICD-10-CM

## 2022-02-13 DIAGNOSIS — M6281 Muscle weakness (generalized): Secondary | ICD-10-CM

## 2022-02-13 DIAGNOSIS — R262 Difficulty in walking, not elsewhere classified: Secondary | ICD-10-CM | POA: Diagnosis not present

## 2022-02-13 DIAGNOSIS — M25671 Stiffness of right ankle, not elsewhere classified: Secondary | ICD-10-CM

## 2022-02-13 NOTE — Addendum Note (Signed)
Addended by: Joellyn Rued on: 02/13/2022 06:05 PM   Modules accepted: Orders

## 2022-02-18 ENCOUNTER — Ambulatory Visit: Payer: Medicare HMO

## 2022-02-18 DIAGNOSIS — M25671 Stiffness of right ankle, not elsewhere classified: Secondary | ICD-10-CM

## 2022-02-18 DIAGNOSIS — R262 Difficulty in walking, not elsewhere classified: Secondary | ICD-10-CM | POA: Diagnosis not present

## 2022-02-18 DIAGNOSIS — M25672 Stiffness of left ankle, not elsewhere classified: Secondary | ICD-10-CM

## 2022-02-18 NOTE — Therapy (Signed)
OUTPATIENT PHYSICAL THERAPY TREATMENT NOTE/10th VISIT PROGRESS NOTE/RE-AUTH   Patient Name: Thomas Warner MRN: [id number] DOB:1961/02/09, 61 y.o., male Today's Date: 02/18/2022  PCP: Jolinda Croak, MD REFERRING PROVIDER: Jolinda Croak, MD  See Note below for Objective Data and Assessment of Progress/Goals.   END OF SESSION:   PT End of Session - 02/18/22 0832     Visit Number 16    Number of Visits 23    Date for PT Re-Evaluation 03/15/22    Authorization Type [employer] MEDICARE HMO; MEDICAID OF [place]    Authorization Time Period 12 PT visits 01/29/2022-03/15/2022    Authorization - Visit Number 13    Authorization - Number of Visits 22    Progress Note Due on Visit 10    PT Start Time 0830    PT Stop Time 0910    PT Time Calculation (min) 40 min    Equipment Utilized During Treatment Other (comment)   RW   Activity Tolerance Patient tolerated treatment well    Behavior During Therapy WFL for tasks assessed/performed                      Past Medical History:  Diagnosis Date   Arthritis    GERD (gastroesophageal reflux disease)    Hypertension    Past Surgical History:  Procedure Laterality Date   LUMBAR LAMINECTOMY/DECOMPRESSION MICRODISCECTOMY N/A 08/03/2021   Procedure: Thoracic Eleven-Twelve  Decompression;  Surgeon: Ashok Pall, MD;  Location: [facility];  Service: Neurosurgery;  Laterality: N/A;  3C/RM 20   NECK SURGERY     approx 2000, plate and screws   Patient Active Problem List   Diagnosis Date Noted   Thoracic spinal stenosis 08/03/2021    REFERRING DIAG: spinal stenosis   THERAPY DIAG:  Difficulty in walking, not elsewhere classified  Stiffness of right ankle, not elsewhere classified  Stiffness of left ankle, not elsewhere classified  PERTINENT HISTORY: Dr. Christella Noa performed a thoracic laminectomy on the patient on 08/03/2021. Cervical fusion 20 yrs ago   PRECAUTIONS: spasticity, abnormal gait and LE weakness  SUBJECTIVE:  No changes to report since last session, has spoken to to brace rep, awaiting insurance authorization to obtain braces.  PAIN:  Are you having pain? 3/10 low back, achy in nature, worst with activity and better with rest.   DIAGNOSTIC FINDINGS:    On: 06/23/2021 IMPRESSION: Cervical spine:   1. 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 2. Status post ACDF C3-C5. 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 3. Multilevel facet and uncovertebral hypertrophy, worst at C6-C7, where it is severe bilaterally, and C5-C6, where it is moderate to severe bilaterally.   Thoracic spine:   1. T11-T12 severe spinal canal stenosis and severe bilateral neural foraminal narrowing. Increased T2 signal within the spinal cord at this level without apparent cord expansion, which may represent cord edema. 2. Moderate to severe bilateral neural foraminal narrowing at T9-T10 and T10-T11. 3. Moderate bilateral neural foraminal narrowing at T1-T2, T2-T3, and T3-T4.   Lumbar spine:   1. L2-L3 and L3-L4 moderate to severe thecal sac narrowing, secondary to severe disc height loss, disc osteophyte complexes, disc bulges, and epidural lipomatosis. L2-L3 moderate bilateral neural foraminal narrowing and L3-L4 moderate to severe bilateral neural foraminal narrowing. 2. L4-L5 moderate thecal sac narrowing, due to the aforementioned factors, severe left and moderate to severe right neural foraminal narrowing.  3. L5-S1 severe bilateral neural foraminal narrowing     PATIENT SURVEYS:  FOTO 56%, 01/08/22= 49%   SCREENING FOR RED FLAGS: Bowel or bladder incontinence: No Spinal tumors: No Cauda equina syndrome: No Compression fracture: No   COGNITION:           Overall cognitive status: Within functional limits for tasks assessed                          SENSATION: Not tested  Pt reports the sensation of walking on bubble wrap on the bottom of his feet.  This is increased after the exercises.    MUSCLE LENGTH: Hamstrings: Right 35 deg; Left 38 deg Tight hip flexors, hip adductors, heel cords bilaterally   POSTURE:  Forward flexed trunk   PALPATION: NT   LE MMT:   MMT Right 12/14/2021 Left 12/14/2021 R 01/18/22 L 01/18/22  Hip flexion 3+ 3 3+ 3+  Hip extension 2 2 3 3   Hip abduction 2 2 3 3   Hip adduction 4 4 4 4   Hip internal rotation 4 4 4 4   Hip external rotation 2 2 3 3   Knee flexion 3 3 3+ 4-  Knee extension 4 4 3+ 3+  Ankle dorsiflexion 2 2 3 3   Ankle plantarflexion 3 3 3 3   Ankle inversion        Ankle eversion         (Blank rows = not tested)   FUNCTIONAL TESTS:  5 times sit to stand: 26.4 s use of hands; 01/18/22 19s 2 minute walk test: 134f; 01/18/22 1154f  GAIT: Distance walked: 106 Assistive device utilized: WaEnvironmental consultant 4 wheeled Level of assistance: Complete Independence Comments: gait deficits include forward flexed trunk, scissor gait, drop foot, genu recurvatum in    stance phase [facility] Adult PT Treatment:                                                DATE: 02/18/22 Therapeutic Exercise: NuStep 8 mins L6 UE/LE FAQs w/add squeeze 15x2 Seated alternating heel slides over towel 15/15 to promote reciprocal motion, 2 sets SKTC B 30s x2 Supine marching alt 15/15 YTB Supine hip fallouts 15x2 YTB, focus on control LTR over ball 15/15 DKTC 15x2 black physioball with OP at end range to stretch lumbar region Seated RB PF/DF 30x Open book 10/10 Walker height raised 1 notch for trial     [facility]                                                [facility]: [facility] Therapeutic Exercise: NuStep 8 mins L6 LE Hinged hip lifting  c 15# KB from 4" box 2x10 Banded side steps at counter 2x8 GTB Seated hamstring curls RTB 2x15 Manual Therapy: PT assisted hip add and IR stertches x5, 20"  [facility] Adult PT Treatment:                                                 DATE: 02/11/22 Therapeutic Exercise: NuStep 8 mins L4  UE/LE SKTC B 30s x2 Supine marching alt 15/15 Supine hip fallouts 15/15, focus on control LTR over ball 15/15 DKTC 15x FAQs 15/15 alternating Seated RB PF/DF 60s  PATIENT EDUCATION:  Education details: Eval findings, POC, HEP Person educated: Patient Education method: Explanation, Demonstration, Tactile cues, Verbal cues, and Handouts Education comprehension: verbalized understanding, returned demonstration, verbal cues required, tactile cues required, and needs further education     HOME EXERCISE PROGRAM: Access Code: [email or web address] URL: [email or web address] Date: 01/02/2022 Prepared by: Gar Ponto  Exercises - Sit to Stand Without Arm Support  - 1 x daily - 7 x weekly - 3 sets - 10 reps - 3 hold - Heel Raises with Counter Support  - 1 x daily - 7 x weekly - 3 sets - 10 reps - 3 hold - Seated Heel Toe Raises  - 1 x daily - 7 x weekly - 3 sets - 10 reps - 2 hold - Hooklying Clamshell with Resistance  - 1 x daily - 7 x weekly - 3 sets - 10 reps - 3 hold - Standing Gastroc Stretch at Counter  - 1 x daily - 7 x weekly - 1 sets - 3 reps - 30 hold - Seated Piriformis Stretch with Trunk Bend  - 1 x daily - 7 x weekly - 1 sets - 3-5 reps - 30 hold - Standing Hip Abduction with Counter Support  - 1 x daily - 7 x weekly - 3 sets - 10 reps - 3 hold   ASSESSMENT:   CLINICAL IMPRESSION: Today's session continued to focus on stretching and reciprocal motions to counteract spasm/tone and improve mobility as well as reduce fall risk.  Added t-band resistance to tasks as noted to challenge control.  Increased walker height appears to assist with toe clearing  OBJECTIVE IMPAIRMENTS Abnormal gait, decreased activity tolerance, decreased balance, difficulty walking, decreased ROM, decreased strength, impaired flexibility, and postural dysfunction.    ACTIVITY LIMITATIONS cleaning, community activity, meal  prep, yard work, shopping, and hobbies .    PERSONAL FACTORS Past/current experiences, Time since onset of injury/illness/exacerbation, and 1-2 comorbidities:    arthritis and previous neck surgery around 2000 are also affecting patient's functional outcome.   REHAB POTENTIAL: Good   CLINICAL DECISION MAKING: Evolving/moderate complexity   EVALUATION COMPLEXITY: Moderate     GOALS:   SHORT TERM GOALS: Target date: 12/28/2021   Pt will be Ind in an initial HEP Baseline:started on eval Goal status: MET   LONG TERM GOALS: Target date: 03/15/22   Increase pt's 2 min walking test to 259f or more as indication of improved function Baseline: 106 c RW; 01/18/22: 2MWT distance 1148fwith RW and SBA Goal status: Ongoing   2.  Decreased pt's 5xSTS time to 20" or less s use of hands Baseline: 26.4 sec without use of hands; 01/18/22: 19s using hands on lap Goal status: MET   3.  Increase pt's hip strength to at least 3+/5 and knee strength to at least 4+/5 for improved function of the LEs 01/18/22: See above flow  sheet    Baseline: See flow sheets Goal status: Improving   4.  Pt will demonstrate improve gait walking with a LRAD, improved gait pattern, and for an increased distance of 40046faseline: 106 ft c RW; 01/18/22 200f66fth TW and SBA Goal status: Improving   5.  Pt will be Ind in a final HEP to maintain achieved LOF Baseline: started on eval Goal status: Ongoing   PLAN: PT FREQUENCY:  2x/week   PT DURATION: 4 weeks   PLANNED INTERVENTIONS: Therapeutic exercises, Therapeutic activity, Neuromuscular re-education, Balance training, Gait training, Patient/Family education, Joint mobilization, Stair training, Aquatic Therapy, Dry Needling, Electrical stimulation, Cryotherapy, Moist heat, Taping, Ultrasound, Ionotophoresis 21m/ml Dexamethasone, and Manual therapy.   PLAN FOR NEXT SESSION: Continue to address flexibility and strength. Continue assessing LTGs. Follow up on patient  concerns over AFOs?  JLeroy SeaPT  02/18/22 9:18 AM

## 2022-02-19 NOTE — Therapy (Signed)
OUTPATIENT PHYSICAL THERAPY TREATMENT NOTE/10th VISIT PROGRESS NOTE/RE-AUTH   Patient Name: Thomas Warner MRN: [id number] DOB:May 14, 1961, 61 y.o., male Today's Date: 02/20/2022  PCP: Jolinda Croak, MD REFERRING PROVIDER: Jolinda Croak, MD  See Note below for Objective Data and Assessment of Progress/Goals.   END OF SESSION:   PT End of Session - 02/20/22 0853     Visit Number 17    Number of Visits 23    Date for PT Re-Evaluation 03/15/22    Authorization Type [employer] MEDICARE HMO; MEDICAID OF [place]    Authorization Time Period 12 PT visits 01/29/2022-03/15/2022    Authorization - Visit Number 14    Authorization - Number of Visits 22    Progress Note Due on Visit 10    PT Start Time 0845    PT Stop Time 0930    PT Time Calculation (min) 45 min    Equipment Utilized During Treatment Other (comment)   RW   Activity Tolerance Patient tolerated treatment well    Behavior During Therapy WFL for tasks assessed/performed                       Past Medical History:  Diagnosis Date   Arthritis    GERD (gastroesophageal reflux disease)    Hypertension    Past Surgical History:  Procedure Laterality Date   LUMBAR LAMINECTOMY/DECOMPRESSION MICRODISCECTOMY N/A 08/03/2021   Procedure: Thoracic Eleven-Twelve  Decompression;  Surgeon: Ashok Pall, MD;  Location: [facility];  Service: Neurosurgery;  Laterality: N/A;  3C/RM 20   NECK SURGERY     approx 2000, plate and screws   Patient Active Problem List   Diagnosis Date Noted   Thoracic spinal stenosis 08/03/2021    REFERRING DIAG: spinal stenosis   THERAPY DIAG:  Difficulty in walking, not elsewhere classified  Stiffness of left ankle, not elsewhere classified  Stiffness of right ankle, not elsewhere classified  Muscle weakness (generalized)  PERTINENT HISTORY: Dr. Christella Noa performed a thoracic laminectomy on the patient on 08/03/2021. Cervical fusion 20 yrs ago   PRECAUTIONS: spasticity, abnormal  gait and LE weakness  SUBJECTIVE: Pt offered no new concerns. Pt notes it feels burning on the bottom of his feet and it feels like he is walking on bubble wrap.  PAIN:  Are you having pain? 0/10 low back, achy in nature, worst with activity and better with rest. My feet   DIAGNOSTIC FINDINGS:    On: 06/23/2021 IMPRESSION: Cervical spine:   1. 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 2. Status post ACDF C3-C5. 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 3. Multilevel facet and uncovertebral hypertrophy, worst at C6-C7, where it is severe bilaterally, and C5-C6, where it is moderate to severe bilaterally.   Thoracic spine:   1. T11-T12 severe spinal canal stenosis and severe bilateral neural foraminal narrowing. Increased T2 signal within the spinal cord at this level without apparent cord expansion, which may represent cord edema. 2. Moderate to severe bilateral neural foraminal narrowing at T9-T10 and T10-T11. 3. Moderate bilateral neural foraminal narrowing at T1-T2, T2-T3, and T3-T4.   Lumbar spine:   1. L2-L3 and L3-L4 moderate to severe thecal sac narrowing, secondary to severe disc height loss, disc osteophyte complexes, disc bulges, and epidural lipomatosis. L2-L3 moderate bilateral neural foraminal narrowing and L3-L4 moderate to severe bilateral neural foraminal narrowing. 2. L4-L5 moderate thecal sac narrowing, due  to the aforementioned factors, severe left and moderate to severe right neural foraminal narrowing. 3. L5-S1 severe bilateral neural foraminal narrowing     PATIENT SURVEYS:  FOTO 56%, 01/08/22= 49%   SCREENING FOR RED FLAGS: Bowel or bladder incontinence: No Spinal tumors: No Cauda equina syndrome: No Compression fracture: No   COGNITION:           Overall cognitive status: Within functional limits for tasks  assessed                          SENSATION: Not tested Pt reports the sensation of walking on bubble wrap on the bottom of his feet.  This is increased after the exercises.    MUSCLE LENGTH: Hamstrings: Right 35 deg; Left 38 deg Tight hip flexors, hip adductors, heel cords bilaterally   POSTURE:  Forward flexed trunk   PALPATION: NT   LE MMT:   MMT Right 12/14/2021 Left 12/14/2021 R 01/18/22 L 01/18/22 R 02/20/22 L 02/20/22  Hip flexion 3+ 3 3+ 3+ 4 3+  Hip extension 2 2 3 3     Hip abduction 2 2 3 3 3 3   Hip adduction 4 4 4 4 4 4   Hip internal rotation 4 4 4 4     Hip external rotation 2 2 3 3     Knee flexion 3 3 3+ 4-    Knee extension 4 4 3+ 3+    Ankle dorsiflexion 2 2 3 3     Ankle plantarflexion 3 3 3 3     Ankle inversion          Ankle eversion           (Blank rows = not tested)   FUNCTIONAL TESTS:  5 times sit to stand: 26.4 s use of hands; 01/18/22 19s 2 minute walk test: 144f; 01/18/22 1124f  GAIT: Distance walked: 106 Assistive device utilized: WaEnvironmental consultant 4 wheeled Level of assistance: Complete Independence Comments: gait deficits include forward flexed trunk, scissor gait, drop foot, genu recurvatum in    stance phase [facility] Adult PT Treatment:                                                DATE: 02/20/22 Therapeutic Exercise: NuStep 6 mins L6 UE/LE LAQs w/add squeeze 15x2 Bridging c ball squeeze x10 5sec SL hip clams RTB 3x10 SL hip abd x5 Seated hamstring curls RTB 2x15 Seated trunk flexion forward and lateral Manual Therapy: PT assisted hip add and IR stertches x5, 20"   [facility] Adult PT Treatment:                                                DATE: 02/18/22 Therapeutic Exercise: NuStep 8 mins L6 UE/LE FAQs w/add squeeze 15x2 Seated alternating heel slides over towel 15/15 to promote reciprocal motion, 2 sets SKTC B 30s x2 Supine marching alt 15/15 YTB Supine hip fallouts 15x2 YTB, focus on control LTR over ball 15/15 DKTC 15x2 black physioball  with OP at end range to stretch lumbar region Seated RB PF/DF 30x Open book 10/10 Walker height raised 1 notch for trial     [facility]  [facility]: 02/[facility]/22 Therapeutic Exercise: NuStep 8 mins L6 LE Hinged hip lifting  c 15# KB from 4" box 2x10 Banded side steps at counter 2x8 GTB Seated hamstring curls RTB 2x15 Manual Therapy: PT assisted hip add and IR stertches x5, 20"  [facility] Adult PT Treatment:                                                DATE: 02/11/22 Therapeutic Exercise: NuStep 8 mins L4 UE/LE SKTC B 30s x2 Supine marching alt 15/15 Supine hip fallouts 15/15, focus on control LTR over ball 15/15 DKTC 15x FAQs 15/15 alternating Seated RB PF/DF 60s  PATIENT EDUCATION:  Education details: Eval findings, POC, HEP Person educated: Patient Education method: Explanation, Demonstration, Tactile cues, Verbal cues, and Handouts Education comprehension: verbalized understanding, returned demonstration, verbal cues required, tactile cues required, and needs further education     HOME EXERCISE PROGRAM: Access Code: [email or web address] URL: [email or web address] Date: 01/02/2022 Prepared by: Gar Ponto  Exercises - Sit to Stand Without Arm Support  - 1 x daily - 7 x weekly - 3 sets - 10 reps - 3 hold - Heel Raises with Counter Support  - 1 x daily - 7 x weekly - 3 sets - 10 reps - 3 hold - Seated Heel Toe Raises  - 1 x daily - 7 x weekly - 3 sets - 10 reps - 2 hold - Hooklying Clamshell with Resistance  - 1 x daily - 7 x weekly - 3 sets - 10 reps - 3 hold - Standing Gastroc Stretch at Counter  - 1 x daily - 7 x weekly - 1 sets - 3 reps - 30 hold - Seated Piriformis Stretch with Trunk Bend  - 1 x daily - 7 x weekly - 1 sets - 3-5 reps - 30 hold - Standing Hip Abduction with Counter Support  - 1 x daily - 7 x weekly - 3 sets - 10 reps - 3 hold   ASSESSMENT:   CLINICAL IMPRESSION: Pt is waiting on insurance approval  for his bilat ankle AFOs. PT was completed to increase bilat hip add/IR flexibility. PT was completed for hip and knee strengthening and for flexibility especially for the hip add and IR. Re-assessment of hip strength found it to be the same as when last assessed. Gait has improved, but neurological deficits affecting tone and strength of the legs is impacting the degree and pace of progress. Pt tolerated the PT session today without adverse effects.  OBJECTIVE IMPAIRMENTS Abnormal gait, decreased activity tolerance, decreased balance, difficulty walking, decreased ROM, decreased strength, impaired flexibility, and postural dysfunction.    ACTIVITY LIMITATIONS cleaning, community activity, meal prep, yard work, shopping, and hobbies .    PERSONAL FACTORS Past/current experiences, Time since onset of injury/illness/exacerbation, and 1-2 comorbidities:    arthritis and previous neck surgery around 2000 are also affecting patient's functional outcome.   REHAB POTENTIAL: Good   CLINICAL DECISION MAKING: Evolving/moderate complexity   EVALUATION COMPLEXITY: Moderate     GOALS:   SHORT TERM GOALS: Target date: 12/28/2021   Pt will be Ind in an initial HEP Baseline:started on eval Goal status: MET   LONG TERM GOALS: Target date: 03/15/22   Increase pt's 2 min walking test to 269f or more as indication of improved function Baseline: 106 c RW; 01/18/22: 2MWT distance 1135fwith RW and  SBA Goal status: Ongoing   2.  Decreased pt's 5xSTS time to 20" or less s use of hands Baseline: 26.4 sec without use of hands; 01/18/22: 19s using hands on lap Goal status: MET   3.  Increase pt's hip strength to at least 3+/5 and knee strength to at least 4+/5 for improved function of the LEs 01/18/22: See above flow  sheet    Baseline: See flow sheets Goal status: Improving   4.  Pt will demonstrate improve gait walking with a LRAD, improved gait pattern, and for an increased distance of 423f Baseline: 106  ft c RW; 01/18/22 2028fwith TW and SBA Goal status: Improving   5.  Pt will be Ind in a final HEP to maintain achieved LOF Baseline: started on eval Goal status: Ongoing   PLAN: PT FREQUENCY: 2x/week   PT DURATION: 4 weeks   PLANNED INTERVENTIONS: Therapeutic exercises, Therapeutic activity, Neuromuscular re-education, Balance training, Gait training, Patient/Family education, Joint mobilization, Stair training, Aquatic Therapy, Dry Needling, Electrical stimulation, Cryotherapy, Moist heat, Taping, Ultrasound, Ionotophoresis 70m11ml Dexamethasone, and Manual therapy.   PLAN FOR NEXT SESSION: Continue to address flexibility and strength. Continue assessing LTGs. Follow up on patient concerns over AFOs?  Halo Laski MS, PT 02/20/22 10:19 AM

## 2022-02-20 ENCOUNTER — Ambulatory Visit: Payer: Medicare HMO

## 2022-02-20 DIAGNOSIS — R262 Difficulty in walking, not elsewhere classified: Secondary | ICD-10-CM

## 2022-02-20 DIAGNOSIS — M6281 Muscle weakness (generalized): Secondary | ICD-10-CM

## 2022-02-20 DIAGNOSIS — M25671 Stiffness of right ankle, not elsewhere classified: Secondary | ICD-10-CM

## 2022-02-20 DIAGNOSIS — M25672 Stiffness of left ankle, not elsewhere classified: Secondary | ICD-10-CM

## 2022-02-25 ENCOUNTER — Ambulatory Visit: Payer: Medicare HMO

## 2022-02-25 DIAGNOSIS — R262 Difficulty in walking, not elsewhere classified: Secondary | ICD-10-CM | POA: Diagnosis not present

## 2022-02-25 DIAGNOSIS — M25672 Stiffness of left ankle, not elsewhere classified: Secondary | ICD-10-CM

## 2022-02-25 DIAGNOSIS — M25671 Stiffness of right ankle, not elsewhere classified: Secondary | ICD-10-CM

## 2022-02-25 NOTE — Therapy (Signed)
OUTPATIENT PHYSICAL THERAPY TREATMENT NOTE/10th VISIT PROGRESS NOTE/RE-AUTH   Patient Name: Thomas Warner MRN: [id number] DOB:1961/04/12, 61 y.o., male Today's Date: 02/25/2022  PCP: Stevphen Rochester, MD REFERRING PROVIDER: Stevphen Rochester, MD  See Note below for Objective Data and Assessment of Progress/Goals.   END OF SESSION:   PT End of Session - 02/25/22 0829     Visit Number 18    Number of Visits 23    Date for PT Re-Evaluation 03/15/22    Authorization Type [employer] MEDICARE HMO; MEDICAID OF [place]    Authorization Time Period 12 PT visits 01/29/2022-03/15/2022    Authorization - Visit Number 14    Authorization - Number of Visits 22    Progress Note Due on Visit 20    PT Start Time 0830    PT Stop Time 0910    PT Time Calculation (min) 40 min    Equipment Utilized During Treatment Other (comment)   RW   Activity Tolerance Patient tolerated treatment well    Behavior During Therapy WFL for tasks assessed/performed                       Past Medical History:  Diagnosis Date   Arthritis    GERD (gastroesophageal reflux disease)    Hypertension    Past Surgical History:  Procedure Laterality Date   LUMBAR LAMINECTOMY/DECOMPRESSION MICRODISCECTOMY N/A 08/03/2021   Procedure: Thoracic Eleven-Twelve  Decompression;  Surgeon: Coletta Memos, MD;  Location: [facility] OR;  Service: Neurosurgery;  Laterality: N/A;  3C/RM 20   NECK SURGERY     approx 2000, plate and screws   Patient Active Problem List   Diagnosis Date Noted   Thoracic spinal stenosis 08/03/2021    REFERRING DIAG: spinal stenosis   THERAPY DIAG:  Difficulty in walking, not elsewhere classified  Stiffness of left ankle, not elsewhere classified  Stiffness of right ankle, not elsewhere classified  PERTINENT HISTORY: Dr. Franky Macho performed a thoracic laminectomy on the patient on 08/03/2021. Cervical fusion 20 yrs ago   PRECAUTIONS: spasticity, abnormal gait and LE weakness  SUBJECTIVE:  Pt offered no new concerns. Pt notes it feels burning on the bottom of his feet and it feels like he is walking on bubble wrap.  PAIN:  Are you having pain? 0/10 low back, achy in nature, worst with activity and better with rest. My feet   DIAGNOSTIC FINDINGS:    On: 06/23/2021 IMPRESSION: Cervical spine:   1. 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 2. Status post ACDF C3-C5. 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 3. Multilevel facet and uncovertebral hypertrophy, worst at C6-C7, where it is severe bilaterally, and C5-C6, where it is moderate to severe bilaterally.   Thoracic spine:   1. T11-T12 severe spinal canal stenosis and severe bilateral neural foraminal narrowing. Increased T2 signal within the spinal cord at this level without apparent cord expansion, which may represent cord edema. 2. Moderate to severe bilateral neural foraminal narrowing at T9-T10 and T10-T11. 3. Moderate bilateral neural foraminal narrowing at T1-T2, T2-T3, and T3-T4.   Lumbar spine:   1. L2-L3 and L3-L4 moderate to severe thecal sac narrowing, secondary to severe disc height loss, disc osteophyte complexes, disc bulges, and epidural lipomatosis. L2-L3 moderate bilateral neural foraminal narrowing and L3-L4 moderate to severe bilateral neural foraminal narrowing. 2. L4-L5 moderate thecal sac narrowing, due to the aforementioned factors,  severe left and moderate to severe right neural foraminal narrowing. 3. L5-S1 severe bilateral neural foraminal narrowing     PATIENT SURVEYS:  FOTO 56%, 01/08/22= 49%   SCREENING FOR RED FLAGS: Bowel or bladder incontinence: No Spinal tumors: No Cauda equina syndrome: No Compression fracture: No   COGNITION:           Overall cognitive status: Within functional limits for tasks assessed                           SENSATION: Not tested Pt reports the sensation of walking on bubble wrap on the bottom of his feet.  This is increased after the exercises.    MUSCLE LENGTH: Hamstrings: Right 35 deg; Left 38 deg Tight hip flexors, hip adductors, heel cords bilaterally   POSTURE:  Forward flexed trunk   PALPATION: NT   LE MMT:   MMT Right 12/14/2021 Left 12/14/2021 R 01/18/22 L 01/18/22 R 02/20/22 L 02/20/22  Hip flexion 3+ 3 3+ 3+ 4 3+  Hip extension 2 2 3 3     Hip abduction 2 2 3 3 3 3   Hip adduction 4 4 4 4 4 4   Hip internal rotation 4 4 4 4     Hip external rotation 2 2 3 3     Knee flexion 3 3 3+ 4-    Knee extension 4 4 3+ 3+    Ankle dorsiflexion 2 2 3 3     Ankle plantarflexion 3 3 3 3     Ankle inversion          Ankle eversion           (Blank rows = not tested)   FUNCTIONAL TESTS:  5 times sit to stand: 26.4 s use of hands; 01/18/22 19s 2 minute walk test: 117ft; 01/18/22 150ft   GAIT: Distance walked: 106 Assistive device utilized: Environmental consultant - 4 wheeled Level of assistance: Complete Independence Comments: gait deficits include forward flexed trunk, scissor gait, drop foot, genu recurvatum in    stance phase  [facility] Adult PT Treatment:                                                DATE: 02/25/22 Therapeutic Exercise: NuStep 8 mins L6 UE/LE FAQs w/add squeeze 15x2 Seated alternating heel slides over towel 15/15 to promote reciprocal motion, 2 sets Supine marching alt 15/15 RTB Supine hip fallouts 15x2 RTB, focus on control Sidelying clams RTB 15/15 DKTC 15x black physioball with OP at end range to stretch lumbar region Seated RB PF/DF 30x Open book 10/10  Neuromuscular re-ed: Trial of BlaTB R AFO to counter tone in RLE  [facility] Adult PT Treatment:                                                DATE: 02/20/22 Therapeutic Exercise: NuStep 6 mins L6 UE/LE LAQs w/add squeeze 15x2 Bridging c ball squeeze x10 5sec SL hip clams RTB 3x10 SL hip abd x5 Seated hamstring curls RTB  2x15 Seated trunk flexion forward and lateral Manual Therapy: PT assisted hip add and IR stertches x5, 20"   [facility] Adult PT Treatment:  DATE: 02/18/22 Therapeutic Exercise: NuStep 8 mins L6 UE/LE FAQs w/add squeeze 15x2 Seated alternating heel slides over towel 15/15 to promote reciprocal motion, 2 sets SKTC B 30s x2 Supine marching alt 15/15 YTB Supine hip fallouts 15x2 YTB, focus on control LTR over ball 15/15 DKTC 15x2 black physioball with OP at end range to stretch lumbar region Seated RB PF/DF 30x Open book 10/10 Walker height raised 1 notch for trial     [facility] Adult PT Treatment:                                                DATE: 02/13/22 Therapeutic Exercise: NuStep 8 mins L6 LE Hinged hip lifting  c 15# KB from 4" box 2x10 Banded side steps at counter 2x8 GTB Seated hamstring curls RTB 2x15 Manual Therapy: PT assisted hip add and IR stertches x5, 20"  [facility] Adult PT Treatment:                                                DATE: 02/11/22 Therapeutic Exercise: NuStep 8 mins L4 UE/LE SKTC B 30s x2 Supine marching alt 15/15 Supine hip fallouts 15/15, focus on control LTR over ball 15/15 DKTC 15x FAQs 15/15 alternating Seated RB PF/DF 60s  PATIENT EDUCATION:  Education details: Eval findings, POC, HEP Person educated: Patient Education method: Explanation, Demonstration, Tactile cues, Verbal cues, and Handouts Education comprehension: verbalized understanding, returned demonstration, verbal cues required, tactile cues required, and needs further education     HOME EXERCISE PROGRAM: Access Code: [email or web address] URL: [email or web address] Date: 01/02/2022 Prepared by: Joellyn Rued  Exercises - Sit to Stand Without Arm Support  - 1 x daily - 7 x weekly - 3 sets - 10 reps - 3 hold - Heel Raises with Counter Support  - 1 x daily - 7 x weekly - 3 sets - 10 reps - 3 hold - Seated Heel Toe Raises  - 1 x daily - 7 x  weekly - 3 sets - 10 reps - 2 hold - Hooklying Clamshell with Resistance  - 1 x daily - 7 x weekly - 3 sets - 10 reps - 3 hold - Standing Gastroc Stretch at Counter  - 1 x daily - 7 x weekly - 1 sets - 3 reps - 30 hold - Seated Piriformis Stretch with Trunk Bend  - 1 x daily - 7 x weekly - 1 sets - 3-5 reps - 30 hold - Standing Hip Abduction with Counter Support  - 1 x daily - 7 x weekly - 3 sets - 10 reps - 3 hold   ASSESSMENT:   CLINICAL IMPRESSION: Awaiting AFO's, noting R side effected more than L.  Little benefit from theraband AFO as it was not enough to counter adductor tone and scissoring.  Today's session continued to focus on reciprocal motions to counter tone and improve mobility as well as reduce fall risk.  Increased resistance on TB exercises  OBJECTIVE IMPAIRMENTS Abnormal gait, decreased activity tolerance, decreased balance, difficulty walking, decreased ROM, decreased strength, impaired flexibility, and postural dysfunction.    ACTIVITY LIMITATIONS cleaning, community activity, meal prep, yard work, shopping, and hobbies .    PERSONAL FACTORS Past/current experiences, Time since onset of injury/illness/exacerbation, and  1-2 comorbidities:    arthritis and previous neck surgery around 2000 are also affecting patient's functional outcome.   REHAB POTENTIAL: Good   CLINICAL DECISION MAKING: Evolving/moderate complexity   EVALUATION COMPLEXITY: Moderate     GOALS:   SHORT TERM GOALS: Target date: 12/28/2021   Pt will be Ind in an initial HEP Baseline:started on eval Goal status: MET   LONG TERM GOALS: Target date: 03/15/22   Increase pt's 2 min walking test to 253ft or more as indication of improved function Baseline: 106 c RW; 01/18/22: distance 149ft with RW and SBA Goal status: Ongoing   2.  Decreased pt's 5xSTS time to 20" or less s use of hands Baseline: 26.4 sec without use of hands; 01/18/22: 19s using hands on lap Goal status: MET   3.  Increase pt's  hip strength to at least 3+/5 and knee strength to at least 4+/5 for improved function of the LEs 01/18/22: See above flow  sheet    Baseline: See flow sheets Goal status: Improving   4.  Pt will demonstrate improve gait walking with a LRAD, improved gait pattern, and for an increased distance of 4105ft Baseline: 106 ft c RW; 01/18/22 2103ft with TW and SBA Goal status: Improving   5.  Pt will be Ind in a final HEP to maintain achieved LOF Baseline: started on eval Goal status: Ongoing   PLAN: PT FREQUENCY: 2x/week   PT DURATION: 4 weeks   PLANNED INTERVENTIONS: Therapeutic exercises, Therapeutic activity, Neuromuscular re-education, Balance training, Gait training, Patient/Family education, Joint mobilization, Stair training, Aquatic Therapy, Dry Needling, Electrical stimulation, Cryotherapy, Moist heat, Taping, Ultrasound, Ionotophoresis 4mg /ml Dexamethasone, and Manual therapy.   PLAN FOR NEXT SESSION: Continue to address flexibility and strength. Continue assessing LTGs. Follow up on patient concerns over AFOs?  Learta Codding PT  02/25/22 9:12 AM

## 2022-02-27 ENCOUNTER — Ambulatory Visit: Payer: Medicare HMO

## 2022-02-27 DIAGNOSIS — M25671 Stiffness of right ankle, not elsewhere classified: Secondary | ICD-10-CM

## 2022-02-27 DIAGNOSIS — R262 Difficulty in walking, not elsewhere classified: Secondary | ICD-10-CM

## 2022-02-27 DIAGNOSIS — M25672 Stiffness of left ankle, not elsewhere classified: Secondary | ICD-10-CM

## 2022-02-27 DIAGNOSIS — M6281 Muscle weakness (generalized): Secondary | ICD-10-CM

## 2022-02-27 NOTE — Therapy (Signed)
OUTPATIENT PHYSICAL THERAPY TREATMENT NOTE/10th VISIT PROGRESS NOTE/RE-AUTH   Patient Name: Thomas Warner MRN: [id number] DOB:06-03-1961, 61 y.o., male Today's Date: 02/28/2022  PCP: Jolinda Croak, MD REFERRING PROVIDER: Jolinda Croak, MD  See Note below for Objective Data and Assessment of Progress/Goals.   END OF SESSION:   PT End of Session - 02/27/22 0853     Visit Number 19    Number of Visits 23    Date for PT Re-Evaluation 03/15/22    Authorization Type [employer] MEDICARE HMO; MEDICAID OF [place]    Authorization Time Period 12 PT visits 01/29/2022-03/15/2022    Authorization - Visit Number 15    Authorization - Number of Visits 22    Progress Note Due on Visit 20    PT Start Time 0848    PT Stop Time 0930    PT Time Calculation (min) 42 min    Equipment Utilized During Treatment Other (comment)   RW   Activity Tolerance Patient tolerated treatment well    Behavior During Therapy WFL for tasks assessed/performed                        Past Medical History:  Diagnosis Date   Arthritis    GERD (gastroesophageal reflux disease)    Hypertension    Past Surgical History:  Procedure Laterality Date   LUMBAR LAMINECTOMY/DECOMPRESSION MICRODISCECTOMY N/A 08/03/2021   Procedure: Thoracic Eleven-Twelve  Decompression;  Surgeon: Ashok Pall, MD;  Location: [place];  Service: Neurosurgery;  Laterality: N/A;  3C/RM 20   NECK SURGERY     approx 2000, plate and screws   Patient Active Problem List   Diagnosis Date Noted   Thoracic spinal stenosis 08/03/2021    REFERRING DIAG: spinal stenosis   THERAPY DIAG:  Difficulty in walking, not elsewhere classified  Stiffness of left ankle, not elsewhere classified  Stiffness of right ankle, not elsewhere classified  Muscle weakness (generalized)  PERTINENT HISTORY: Dr. Christella Noa performed a thoracic laminectomy on the patient on 08/03/2021. Cervical fusion 20 yrs ago   PRECAUTIONS: spasticity, abnormal  gait and LE weakness  SUBJECTIVE: Pt offered no new concerns.  PAIN:  Are you having pain? 0/10 low back, achy in nature, worst with activity and better with rest. My feet   DIAGNOSTIC FINDINGS:    On: 06/23/2021 IMPRESSION: Cervical spine:   1. 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 2. Status post ACDF C3-C5. 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 3. Multilevel facet and uncovertebral hypertrophy, worst at C6-C7, where it is severe bilaterally, and C5-C6, where it is moderate to severe bilaterally.   Thoracic spine:   1. T11-T12 severe spinal canal stenosis and severe bilateral neural foraminal narrowing. Increased T2 signal within the spinal cord at this level without apparent cord expansion, which may represent cord edema. 2. Moderate to severe bilateral neural foraminal narrowing at T9-T10 and T10-T11. 3. Moderate bilateral neural foraminal narrowing at T1-T2, T2-T3, and T3-T4.   Lumbar spine:   1. L2-L3 and L3-L4 moderate to severe thecal sac narrowing, secondary to severe disc height loss, disc osteophyte complexes, disc bulges, and epidural lipomatosis. L2-L3 moderate bilateral neural foraminal narrowing and L3-L4 moderate to severe bilateral neural foraminal narrowing. 2. L4-L5 moderate thecal sac narrowing, due to the aforementioned factors, severe left and moderate to severe right neural foraminal narrowing. 3. L5-S1 severe bilateral neural foraminal  narrowing     PATIENT SURVEYS:  FOTO 56%, 01/08/22= 49%   SCREENING FOR RED FLAGS: Bowel or bladder incontinence: No Spinal tumors: No Cauda equina syndrome: No Compression fracture: No   COGNITION:           Overall cognitive status: Within functional limits for tasks assessed                          SENSATION: Not tested Pt reports the sensation of walking on bubble  wrap on the bottom of his feet.  This is increased after the exercises.    MUSCLE LENGTH: Hamstrings: Right 35 deg; Left 38 deg Tight hip flexors, hip adductors, heel cords bilaterally   POSTURE:  Forward flexed trunk   PALPATION: NT   LE MMT:   MMT Right 12/14/2021 Left 12/14/2021 R 01/18/22 L 01/18/22 R 02/20/22 L 02/20/22  Hip flexion 3+ 3 3+ 3+ 4 3+  Hip extension 2 2 3 3     Hip abduction 2 2 3 3 3 3   Hip adduction 4 4 4 4 4 4   Hip internal rotation 4 4 4 4     Hip external rotation 2 2 3 3     Knee flexion 3 3 3+ 4-    Knee extension 4 4 3+ 3+    Ankle dorsiflexion 2 2 3 3     Ankle plantarflexion 3 3 3 3     Ankle inversion          Ankle eversion           (Blank rows = not tested)   FUNCTIONAL TESTS:  5 times sit to stand: 26.4 s use of hands; 01/18/22 19s 2 minute walk test: 149f; 01/18/22 1138f  GAIT: Distance walked: 106 Assistive device utilized: WaEnvironmental consultant 4 wheeled Level of assistance: Complete Independence Comments: gait deficits include forward flexed trunk, scissor gait, drop foot, genu recurvatum in    stance phase  [facility] Adult PT Treatment:                                                DATE: 02/27/22 Therapeutic Exercise: NuStep 8 mins L8 UE/LE Abdominal bracing UE 90 LE 90 x4 15sec Dead bug alt opp UE/LE 3x10 each Figure 4 stretch x2 20" Quad arm lifts x10 each ( LE too difficult) Bridging 2x10 LTR x10 Open book x10 STS 2x5 s use of hands Side stepping at counter [id number] Manual Therapy: PT assisted hip add and IR stertches x5, 20"  [facility] Adult PT Treatment:                                                DATE: 02/25/22 Therapeutic Exercise: NuStep 8 mins L6 UE/LE FAQs w/add squeeze 15x2 Seated alternating heel slides over towel 15/15 to promote reciprocal motion, 2 sets Supine marching alt 15/15 RTB Supine hip fallouts 15x2 RTB, focus on control Sidelying clams RTB 15/15 DKTC 15x black physioball with OP at end range to stretch lumbar  region Seated RB PF/DF 30x Open book 10/10  Neuromuscular re-ed: Trial of BlaTB R AFO to counter tone in RLE  [facility]  [facility] Therapeutic Exercise: NuStep 6 mins L6 UE/LE LAQs w/add squeeze 15x2 Bridging c ball squeeze x10 5sec SL hip clams RTB 3x10 SL hip abd x5 Seated hamstring curls RTB 2x15 Seated trunk flexion forward and lateral Manual Therapy: PT assisted hip add and IR stertches x5, 20"    PATIENT EDUCATION:  Education details: Eval findings, POC, HEP Person educated: Patient Education method: Explanation, Demonstration, Tactile cues, Verbal cues, and Handouts Education comprehension: verbalized understanding, returned demonstration, verbal cues required, tactile cues required, and needs further education     HOME EXERCISE PROGRAM: Access Code: [email or web address] URL: [email or web address] Date: 01/02/2022 Prepared by: Gar Ponto  Exercises - Sit to Stand Without Arm Support  - 1 x daily - 7 x weekly - 3 sets - 10 reps - 3 hold - Heel Raises with Counter Support  - 1 x daily - 7 x weekly - 3 sets - 10 reps - 3 hold - Seated Heel Toe Raises  - 1 x daily - 7 x weekly - 3 sets - 10 reps - 2 hold - Hooklying Clamshell with Resistance  - 1 x daily - 7 x weekly - 3 sets - 10 reps - 3 hold - Standing Gastroc Stretch at Counter  - 1 x daily - 7 x weekly - 1 sets - 3 reps - 30 hold - Seated Piriformis Stretch with Trunk Bend  - 1 x daily - 7 x weekly - 1 sets - 3-5 reps - 30 hold - Standing Hip Abduction with Counter Support  - 1 x daily - 7 x weekly - 3 sets - 10 reps - 3 hold   ASSESSMENT:   CLINICAL IMPRESSION: PT was completed for hip add/IR flexibility, LE and core strengthening, and reciprocal movements to lessen tone. Waiting for insurance approval for AFOs. Pt participates with good effort. Leg scissoring with gait is improved, but scissoring can occur at a higher level periodically  impacting gait quality. Pt is mindful of this to reduce fall risk and to be safe as possible.  OBJECTIVE IMPAIRMENTS Abnormal gait, decreased activity tolerance, decreased balance, difficulty walking, decreased ROM, decreased strength, impaired flexibility, and postural dysfunction.    ACTIVITY LIMITATIONS cleaning, community activity, meal prep, yard work, shopping, and hobbies .    PERSONAL FACTORS Past/current experiences, Time since onset of injury/illness/exacerbation, and 1-2 comorbidities:    arthritis and previous neck surgery around 2000 are also affecting patient's functional outcome.   REHAB POTENTIAL: Good   CLINICAL DECISION MAKING: Evolving/moderate complexity   EVALUATION COMPLEXITY: Moderate     GOALS:   SHORT TERM GOALS: Target date: 12/28/2021   Pt will be Ind in an initial HEP Baseline:started on eval Goal status: MET   LONG TERM GOALS: Target date: 03/15/22   Increase pt's 2 min walking test to 229f or more as indication of improved function Baseline: 106 c RW; 01/18/22: 2MWT distance 1172fwith RW and SBA Goal status: Ongoing   2.  Decreased pt's 5xSTS time to 20" or less s use of hands Baseline: 26.4 sec without use of hands; 01/18/22: 19s using hands on lap 02/27/22: 18" Goal status: MET   3.  Increase pt's hip strength to at least 3+/5 and knee strength to at least 4+/5 for improved function of the LEs 01/18/22: See above flow  sheet    Baseline: See flow sheets Goal status: Improving   4.  Pt will demonstrate improve gait walking with a LRAD, improved gait pattern, and for an increased distance of  448f Baseline: 106 ft c RW; 01/18/22 2076fwith TW and SBA Goal status: Improving   5.  Pt will be Ind in a final HEP to maintain achieved LOF Baseline: started on eval Goal status: Ongoing   PLAN: PT FREQUENCY: 2x/week   PT DURATION: 4 weeks   PLANNED INTERVENTIONS: Therapeutic exercises, Therapeutic activity, Neuromuscular re-education, Balance  training, Gait training, Patient/Family education, Joint mobilization, Stair training, Aquatic Therapy, Dry Needling, Electrical stimulation, Cryotherapy, Moist heat, Taping, Ultrasound, Ionotophoresis 50m66ml Dexamethasone, and Manual therapy.   PLAN FOR NEXT SESSION: Continue to address flexibility and strength. Continue assessing LTGs. Follow up on patient concerns over AFOs?  Eryk Beavers MS, PT 02/28/22 5:09 AM

## 2022-03-12 ENCOUNTER — Ambulatory Visit: Payer: Medicare HMO | Attending: Neurosurgery

## 2022-03-12 DIAGNOSIS — M6281 Muscle weakness (generalized): Secondary | ICD-10-CM | POA: Diagnosis present

## 2022-03-12 DIAGNOSIS — M25672 Stiffness of left ankle, not elsewhere classified: Secondary | ICD-10-CM | POA: Diagnosis present

## 2022-03-12 DIAGNOSIS — M25671 Stiffness of right ankle, not elsewhere classified: Secondary | ICD-10-CM | POA: Insufficient documentation

## 2022-03-12 DIAGNOSIS — R262 Difficulty in walking, not elsewhere classified: Secondary | ICD-10-CM | POA: Diagnosis not present

## 2022-03-12 NOTE — Therapy (Signed)
OUTPATIENT PHYSICAL THERAPY TREATMENT NOTE/20th VISIT PROGRESS NOTE   Patient Name: Thomas Warner MRN: [id number] DOB:1960/10/24, 61 y.o., male Today's Date: 03/12/2022  PCP: Jolinda Croak, MD REFERRING PROVIDER: Jolinda Croak, MD  See Note below for Objective Data and Assessment of Progress/Goals.  Progress Note Reporting Period 01/25/22 to 03/12/22  See note below for Objective Data and Assessment of Progress/Goals.      END OF SESSION:   PT End of Session - 03/12/22 0845     Visit Number 20    Number of Visits 23    Date for PT Re-Evaluation 03/15/22    Authorization Type [employer] MEDICARE HMO; MEDICAID OF [place]    Authorization Time Period 12 PT visits 01/29/2022-03/15/2022    Authorization - Visit Number 17    Authorization - Number of Visits 22    Progress Note Due on Visit 20    PT Start Time 0845    PT Stop Time 0930    PT Time Calculation (min) 45 min    Equipment Utilized During Treatment Other (comment)   RW   Activity Tolerance Patient tolerated treatment well    Behavior During Therapy WFL for tasks assessed/performed                         Past Medical History:  Diagnosis Date   Arthritis    GERD (gastroesophageal reflux disease)    Hypertension    Past Surgical History:  Procedure Laterality Date   LUMBAR LAMINECTOMY/DECOMPRESSION MICRODISCECTOMY N/A 08/03/2021   Procedure: Thoracic Eleven-Twelve  Decompression;  Surgeon: Ashok Pall, MD;  Location: [facility];  Service: Neurosurgery;  Laterality: N/A;  3C/RM 20   NECK SURGERY     approx 2000, plate and screws   Patient Active Problem List   Diagnosis Date Noted   Thoracic spinal stenosis 08/03/2021    REFERRING DIAG: spinal stenosis   THERAPY DIAG:  Difficulty in walking, not elsewhere classified  Stiffness of left ankle, not elsewhere classified  Stiffness of right ankle, not elsewhere classified  Muscle weakness (generalized)  PERTINENT HISTORY: Dr. Christella Noa  performed a thoracic laminectomy on the patient on 08/03/2021. Cervical fusion 20 yrs ago   PRECAUTIONS: spasticity, abnormal gait and LE weakness  SUBJECTIVE: Pt states he does not knw the satus of his insurance approval for the AFOs.  PAIN:  Are you having pain? 0/10 low back, achy in nature, worst with activity and better with rest. My feet have a burning and tingling sensation.    DIAGNOSTIC FINDINGS:    On: 06/23/2021 IMPRESSION: Cervical spine:   1. 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 2. Status post ACDF C3-C5. 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 3. Multilevel facet and uncovertebral hypertrophy, worst at C6-C7, where it is severe bilaterally, and C5-C6, where it is moderate to severe bilaterally.   Thoracic spine:   1. T11-T12 severe spinal canal stenosis and severe bilateral neural foraminal narrowing. Increased T2 signal within the spinal cord at this level without apparent cord expansion, which may represent cord edema. 2. Moderate to severe bilateral neural foraminal narrowing at T9-T10 and T10-T11. 3. Moderate bilateral neural foraminal narrowing at T1-T2, T2-T3, and T3-T4.   Lumbar spine:   1. L2-L3 and L3-L4 moderate to severe thecal sac narrowing, secondary to severe disc height loss, disc osteophyte complexes, disc bulges, and epidural lipomatosis. L2-L3 moderate  bilateral neural foraminal narrowing and L3-L4 moderate to severe bilateral neural foraminal narrowing. 2. L4-L5 moderate thecal sac narrowing, due to the aforementioned factors, severe left and moderate to severe right neural foraminal narrowing. 3. L5-S1 severe bilateral neural foraminal narrowing     PATIENT SURVEYS:  FOTO 56%, 01/08/22= 49%   SCREENING FOR RED FLAGS: Bowel or bladder incontinence: No Spinal tumors: No Cauda equina syndrome:  No Compression fracture: No   COGNITION:           Overall cognitive status: Within functional limits for tasks assessed                          SENSATION: Not tested Pt reports the sensation of walking on bubble wrap on the bottom of his feet.  This is increased after the exercises.    MUSCLE LENGTH: Hamstrings: Right 35 deg; Left 38 deg Tight hip flexors, hip adductors, heel cords bilaterally   POSTURE:  Forward flexed trunk   PALPATION: NT   LE MMT:   MMT Right 12/14/2021 Left 12/14/2021 R 01/18/22 L 01/18/22 R 02/20/22 L 02/20/22  Hip flexion 3+ 3 3+ 3+ 4 3+  Hip extension 2 2 3 3     Hip abduction 2 2 3 3 3 3   Hip adduction 4 4 4 4 4 4   Hip internal rotation 4 4 4 4     Hip external rotation 2 2 3 3     Knee flexion 3 3 3+ 4-    Knee extension 4 4 3+ 3+    Ankle dorsiflexion 2 2 3 3     Ankle plantarflexion 3 3 3 3     Ankle inversion          Ankle eversion           (Blank rows = not tested)   FUNCTIONAL TESTS:  5 times sit to stand: 26.4 s use of hands; 01/18/22 19s; 03/12/22= 15.5 sec 2 minute walk test: 136f; 01/18/22 1143f 03/12/22=16080f;  GAIT: Distance walked: 106.   03/12/22= [id number] device utilized: WalkEnvironmental consultant wheeled Level of assistance: Complete Independence Comments: gait deficits include forward flexed trunk, scissor gait, drop foot, genu recurvatum in    stance phase  [facility] Adult PT Treatment:                                                DATE: 03/12/22 Therapeutic Exercise: NuStep 6 mins L6 LE Abdominal bracing UE 90 LE 90 x4 15sec Dead bug alt opp UE/LE 3x10 each Figure 4 stretch x2 20" Bridging 2x10 Side stepping at counter 2x6x8ft 98f Dead lift from 18" Therapeutic Activity: 5xSTS 2MWT  Walking for distance 370'  [facility] Adult PT Treatment:                                                DATE: 02/27/22 Therapeutic Exercise: NuStep 8 mins L8 UE/LE Abdominal bracing UE 90 LE 90 x4 15sec Dead bug alt opp UE/LE 3x10 each Figure 4  stretch x2 20" Quad arm lifts x10 each ( LE too difficult) Bridging 2x10 LTR x10 Open book x10 STS 2x5 s use of hands Side stepping at counter 2x4x8ft G7fManual  Therapy: PT assisted hip add and IR stertches x5, 20"  [facility] Adult PT Treatment:                                                DATE: 02/25/22 Therapeutic Exercise: NuStep 8 mins L6 UE/LE FAQs w/add squeeze 15x2 Seated alternating heel slides over towel 15/15 to promote reciprocal motion, 2 sets Supine marching alt 15/15 RTB Supine hip fallouts 15x2 RTB, focus on control Sidelying clams RTB 15/15 DKTC 15x black physioball with OP at end range to stretch lumbar region Seated RB PF/DF 30x Open book 10/10  Neuromuscular re-ed: Trial of BlaTB R AFO to counter tone in RL  PATIENT EDUCATION:  Education details: Eval findings, POC, HEP Person educated: Patient Education method: Explanation, Demonstration, Tactile cues, Verbal cues, and Handouts Education comprehension: verbalized understanding, returned demonstration, verbal cues required, tactile cues required, and needs further education     HOME EXERCISE PROGRAM: Access Code: [email or web address] URL: [email or web address] Date: 01/02/2022 Prepared by: Gar Ponto  Exercises - Sit to Stand Without Arm Support  - 1 x daily - 7 x weekly - 3 sets - 10 reps - 3 hold - Heel Raises with Counter Support  - 1 x daily - 7 x weekly - 3 sets - 10 reps - 3 hold - Seated Heel Toe Raises  - 1 x daily - 7 x weekly - 3 sets - 10 reps - 2 hold - Hooklying Clamshell with Resistance  - 1 x daily - 7 x weekly - 3 sets - 10 reps - 3 hold - Standing Gastroc Stretch at Counter  - 1 x daily - 7 x weekly - 1 sets - 3 reps - 30 hold - Seated Piriformis Stretch with Trunk Bend  - 1 x daily - 7 x weekly - 1 sets - 3-5 reps - 30 hold - Standing Hip Abduction with Counter Support  - 1 x daily - 7 x weekly - 3 sets - 10 reps - 3 hold   ASSESSMENT:   CLINICAL IMPRESSION: PT was completed for  core, trunk and LE strengthening. Functional activities were completed and reassessed vs. previous efforts. With functional activities, pt continues to demonstrate progressive improvement, see LTGs. There is still no news re: AFO braces and insurance approval. Will f/u with the rep. Muscle tone and foot drop impact quality of gait. AFOs should be beneficial with assisting with gait quality and safety.    OBJECTIVE IMPAIRMENTS Abnormal gait, decreased activity tolerance, decreased balance, difficulty walking, decreased ROM, decreased strength, impaired flexibility, and postural dysfunction.    ACTIVITY LIMITATIONS cleaning, community activity, meal prep, yard work, shopping, and hobbies .    PERSONAL FACTORS Past/current experiences, Time since onset of injury/illness/exacerbation, and 1-2 comorbidities:    arthritis and previous neck surgery around 2000 are also affecting patient's functional outcome.   REHAB POTENTIAL: Good   CLINICAL DECISION MAKING: Evolving/moderate complexity   EVALUATION COMPLEXITY: Moderate     GOALS:   SHORT TERM GOALS: Target date: 12/28/2021   Pt will be Ind in an initial HEP Baseline:started on eval Goal status: MET   LONG TERM GOALS: Target date: 03/15/22   Increase pt's 2 min walking test to 239f or more as indication of improved function Baseline: 106 c RW; 01/18/22: 2MWT distance 1155fwith RW and SBA. 03/12/22=18548foal status: Improving   2.  Decreased pt's 5xSTS time to 20" or less s use of hands Baseline: 26.4 sec without use of hands; 01/18/22: 19s using hands on lap 02/27/22: 18". 03/12/22=15.5 sec Goal status: MET-and continuing to improve   3.  Increase pt's hip strength to at least 3+/5 and knee strength to at least 4+/5 for improved function of the LEs 01/18/22: See above flow  sheet    Baseline: See flow sheets Goal status: Improving   4.  Pt will demonstrate improve gait walking with a LRAD, improved gait pattern, and for an increased  distance of 463f Baseline: 106 ft c RW; 01/18/22 2071fwith TW and SBA. 03/12/22= 37022foal status: Improving   5.  Pt will be Ind in a final HEP to maintain achieved LOF Baseline: started on eval Goal status: Ongoing   PLAN: PT FREQUENCY: 2x/week   PT DURATION: 4 weeks   PLANNED INTERVENTIONS: Therapeutic exercises, Therapeutic activity, Neuromuscular re-education, Balance training, Gait training, Patient/Family education, Joint mobilization, Stair training, Aquatic Therapy, Dry Needling, Electrical stimulation, Cryotherapy, Moist heat, Taping, Ultrasound, Ionotophoresis 4mg80m Dexamethasone, and Manual therapy.   PLAN FOR NEXT SESSION: Continue to address flexibility and strength. Continue assessing LTGs. Follow up on patient concerns over AFOs?  Nataliya Graig MS, PT 03/12/22 9:59 AM

## 2022-03-13 NOTE — Therapy (Signed)
OUTPATIENT PHYSICAL THERAPY TREATMENT NOTE/Re-Cert/Re-Auth   Patient Name: Thomas Warner MRN: [id number] DOB:16-Jul-1961, 61 y.o., male Today's Date: 03/14/2022  PCP: Jolinda Croak, MD REFERRING PROVIDER: Jolinda Croak, MD  See Note below for Objective Data and Assessment of Progress/Goals.   See note below for Objective Data and Assessment of Progress/Goals.      END OF SESSION:   PT End of Session - 03/14/22 0800     Visit Number 21    Number of Visits 33    Date for PT Re-Evaluation 05/03/22    Authorization Type [employer] MEDICARE HMO; MEDICAID OF [place]    Authorization Time Period 12 PT visits 01/29/2022-03/15/2022; 03/14/22- New Auth requested    Authorization - Visit Number 18    Authorization - Number of Visits 22    Progress Note Due on Visit 30    PT Start Time 0845    PT Stop Time 0930    PT Time Calculation (min) 45 min    Equipment Utilized During Treatment Other (comment)   RW   Activity Tolerance Patient tolerated treatment well    Behavior During Therapy WFL for tasks assessed/performed                          Past Medical History:  Diagnosis Date   Arthritis    GERD (gastroesophageal reflux disease)    Hypertension    Past Surgical History:  Procedure Laterality Date   LUMBAR LAMINECTOMY/DECOMPRESSION MICRODISCECTOMY N/A 08/03/2021   Procedure: Thoracic Eleven-Twelve  Decompression;  Surgeon: Ashok Pall, MD;  Location: [facility];  Service: Neurosurgery;  Laterality: N/A;  3C/RM 20   NECK SURGERY     approx 2000, plate and screws   Patient Active Problem List   Diagnosis Date Noted   Thoracic spinal stenosis 08/03/2021    REFERRING DIAG: spinal stenosis   THERAPY DIAG:  Difficulty in walking, not elsewhere classified  Stiffness of left ankle, not elsewhere classified  Stiffness of right ankle, not elsewhere classified  Muscle weakness (generalized)  PERTINENT HISTORY: Dr. Christella Noa performed a thoracic laminectomy on  the patient on 08/03/2021. Cervical fusion 20 yrs ago   PRECAUTIONS: spasticity, abnormal gait and LE weakness  SUBJECTIVE: Pt reports he hopes to hear from the AFO representative today.  PAIN:  Are you having pain? 0/10 low back, achy in nature, worst with activity and better with rest. My feet have a burning and tingling sensation.    DIAGNOSTIC FINDINGS:    On: 06/23/2021 IMPRESSION: Cervical spine:   1. 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 2. Status post ACDF C3-C5. 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 3. Multilevel facet and uncovertebral hypertrophy, worst at C6-C7, where it is severe bilaterally, and C5-C6, where it is moderate to severe bilaterally.   Thoracic spine:   1. T11-T12 severe spinal canal stenosis and severe bilateral neural foraminal narrowing. Increased T2 signal within the spinal cord at this level without apparent cord expansion, which may represent cord edema. 2. Moderate to severe bilateral neural foraminal narrowing at T9-T10 and T10-T11. 3. Moderate bilateral neural foraminal narrowing at T1-T2, T2-T3, and T3-T4.   Lumbar spine:   1. L2-L3 and L3-L4 moderate to severe thecal sac narrowing, secondary to severe disc height loss, disc osteophyte complexes, disc bulges, and epidural lipomatosis. L2-L3 moderate bilateral neural foraminal narrowing and L3-L4 moderate to severe  bilateral neural foraminal narrowing. 2. L4-L5 moderate thecal sac narrowing, due to the aforementioned factors, severe left and moderate to severe right neural foraminal narrowing. 3. L5-S1 severe bilateral neural foraminal narrowing     PATIENT SURVEYS:  FOTO 56%, 01/08/22= 49%   SCREENING FOR RED FLAGS: Bowel or bladder incontinence: No Spinal tumors: No Cauda equina syndrome: No Compression fracture: No   COGNITION:            Overall cognitive status: Within functional limits for tasks assessed                          SENSATION: Not tested Pt reports the sensation of walking on bubble wrap on the bottom of his feet.  This is increased after the exercises.    MUSCLE LENGTH: Hamstrings: Right 35 deg; Left 38 deg Tight hip flexors, hip adductors, heel cords bilaterally   POSTURE:  Forward flexed trunk   PALPATION: NT   LE MMT:   MMT Right 12/14/2021 Left 12/14/2021 R 01/18/22 L 01/18/22 R 02/20/22 L 02/20/22  Hip flexion 3+ 3 3+ 3+ 4 3+  Hip extension 2 2 3 3     Hip abduction 2 2 3 3 3 3   Hip adduction 4 4 4 4 4 4   Hip internal rotation 4 4 4 4     Hip external rotation 2 2 3 3     Knee flexion 3 3 3+ 4-    Knee extension 4 4 3+ 3+    Ankle dorsiflexion 2 2 3 3     Ankle plantarflexion 3 3 3 3     Ankle inversion          Ankle eversion           (Blank rows = not tested) MMT Right 03/14/22 Left 03/14/22 R  L  R  L   Hip flexion 4 4+      Hip extension 3 4      Hip abduction 3 3      Hip adduction 4 4+      Hip internal rotation 4 4+      Hip external rotation 3 3+      Knee flexion 4+ 5      Knee extension 5 5      Ankle dorsiflexion        Ankle plantarflexion        Ankle inversion          Ankle eversion           FUNCTIONAL TESTS:  5 times sit to stand: 26.4 s use of hands; 01/18/22 19s; 03/12/22= 15.5 sec 2 minute walk test: 149f; 01/18/22 1159f 03/12/22=16076f;  GAIT: Distance walked: 106.   03/12/22= 370f60fsistive device utilized: WalkEnvironmental consultant wheeled Level of assistance: Complete Independence Comments: gait deficits include forward flexed trunk, scissor gait, drop foot, genu recurvatum in    stance phase  [facility] Adult PT Treatment:                                                DATE: 03/14/22 Therapeutic Exercise: NuStep 6 mins L5 LE Abdominal bracing UE 90 LE 90 x4 15sec Standing hip ext x FM bar 2x10 Standing hip abdt x FM bar 2x10 Side stepping at counter 8ft 5f min A  hand assist  Dead lift from 79" 3x1015# Saeted hamstrings curls 2x15 GTB MMT of the Legs  [facility] Adult PT Treatment:                                                DATE: 03/12/22 Therapeutic Exercise: NuStep 6 mins L6 LE Abdominal bracing UE 90 LE 90 x4 15sec Dead bug alt opp UE/LE 3x10 each Figure 4 stretch x2 20" Bridging 2x10 Side stepping at counter 2x6x33f GTB Dead lift from 103/08/2023 Therapeutic Activity: 5xSTS 2MWT  Walking for distance 370'  [facility] Adult PT Treatment:                                                DATE: 02/27/22 Therapeutic Exercise: NuStep 8 mins L8 UE/LE Abdominal bracing UE 90 LE 90 x4 15sec Dead bug alt opp UE/LE 3x10 each Figure 4 stretch x2 20" Quad arm lifts x10 each ( LE too difficult) Bridging 2x10 LTR x10 Open book x10 STS 2x5 s use of hands Side stepping at counter 2x4x826fGTB Manual Therapy: PT assisted hip add and IR stertches x5, 20"  PATIENT EDUCATION:  Education details: Eval findings, POC, HEP Person educated: Patient Education method: Explanation, Demonstration, Tactile cues, Verbal cues, and Handouts Education comprehension: verbalized understanding, returned demonstration, verbal cues required, tactile cues required, and needs further education     HOME EXERCISE PROGRAM: Access Code: [email or web address] URL: [email or web address] Date: 01/02/2022 Prepared by: AlGar PontoExercises - Sit to Stand Without Arm Support  - 1 x daily - 7 x weekly - 3 sets - 10 reps - 3 hold - Heel Raises with Counter Support  - 1 x daily - 7 x weekly - 3 sets - 10 reps - 3 hold - Seated Heel Toe Raises  - 1 x daily - 7 x weekly - 3 sets - 10 reps - 2 hold - Hooklying Clamshell with Resistance  - 1 x daily - 7 x weekly - 3 sets - 10 reps - 3 hold - Standing Gastroc Stretch at Counter  - 1 x daily - 7 x weekly - 1 sets - 3 reps - 30 hold - Seated Piriformis Stretch with Trunk Bend  - 1 x daily - 7 x weekly - 1 sets - 3-5 reps - 30 hold - Standing Hip  Abduction with Counter Support  - 1 x daily - 7 x weekly - 3 sets - 10 reps - 3 hold   ASSESSMENT:   CLINICAL IMPRESSION: PT participated in PT for LE and trunk strengthening in both the open and closed kinetic chains. Pt is making approriate progress in PT re: leg strength and improved function. Progress slow due to neurological deficits. See LTGs below for progress in these areas. Impairments affecting function are bilat leg neurological tone causing scissoring of legs and ankle drop foot which decreases quality and ease of ambulation. Pt has been awaiting approval of AFOs which will help with improving gait. Pt will benefit from continued PT to address strength, ROM, and gait to optimize pt's safety, ease of, and tolerance for functional mobility.  OBJECTIVE IMPAIRMENTS Abnormal gait, decreased activity tolerance, decreased balance, difficulty walking, decreased ROM, decreased strength, impaired flexibility, and postural dysfunction.    ACTIVITY  LIMITATIONS cleaning, community activity, meal prep, yard work, shopping, and hobbies .    PERSONAL FACTORS Past/current experiences, Time since onset of injury/illness/exacerbation, and 1-2 comorbidities:    arthritis and previous neck surgery around 2000 are also affecting patient's functional outcome.   REHAB POTENTIAL: Good   CLINICAL DECISION MAKING: Evolving/moderate complexity   EVALUATION COMPLEXITY: Moderate     GOALS:   SHORT TERM GOALS: Target date: 12/28/2021   Pt will be Ind in an initial HEP Baseline:started on eval Goal status: MET   LONG TERM GOALS: Target date: 03/15/22   Increase pt's 2 min walking test to 267f or more as indication of improved function Baseline: 106 c RW; 01/18/22: 2MWT distance 1160fwith RW and SBA. 03/12/22=18568foal status: Improving   2.  Decreased pt's 5xSTS time to 20" or less s use of hands Baseline: 26.4 sec without use of hands; 01/18/22: 19s using hands on lap 02/27/22: 18". 03/12/22=15.5  sec Goal status: MET-and continuing to improve   3.  Increase pt's hip strength to at least 3+/5 and knee strength to at least 4+/5 for improved function of the LEs 01/18/22: See above flow  sheet    Baseline: See flow sheets Goal status: Improving   4.  Pt will demonstrate improve gait walking with a LRAD, improved gait pattern, and for an increased distance of 400f68fseline: 106 ft c RW; 01/18/22 200ft40fh TW and SBA. 03/12/22= 370ft 64f status: Improving   5.  Pt will be Ind in a final HEP to maintain achieved LOF Baseline: started on eval Goal status: Ongoing   PLAN: PT FREQUENCY: 2x/week   PT DURATION: 6 weeks   PLANNED INTERVENTIONS: Therapeutic exercises, Therapeutic activity, Neuromuscular re-education, Balance training, Gait training, Patient/Family education, Joint mobilization, Stair training, Aquatic Therapy, Dry Needling, Electrical stimulation, Cryotherapy, Moist heat, Taping, Ultrasound, Ionotophoresis 4mg/ml60mxamethasone, and Manual therapy.   PLAN FOR NEXT SESSION: Continue to address flexibility and strength. Continue assessing LTGs. Follow up on patient concerns over AFOs?  Rolf Fells MS, PT 03/14/22 10:30 PM  Referring diagnosis? spinal stenosis  Treatment diagnosis? (if different than referring diagnosis) Difficulty in walking, not elsewhere classified  Stiffness of left ankle, not elsewhere classified  Stiffness of right ankle, not elsewhere classified  Muscle weakness (generalized) What was this (referring dx) caused by? [x]  Surgery []  Fall [x]  Ongoing issue []  Arthritis []  Other: ____________  Laterality: []  Rt []  Lt [x]  Both  Check all possible CPT codes:  *CHOOSE 10 OR LESS*    [x]  97110 (Therapeutic Exercise)  []  92507 (SLP Treatment)  [x]  97112 (24401 Re-ed)   []  92526 (Swallowing Treatment)   [x]  97116 (Gait Training)   []  97129 ([id number] Training, 1st 15 minutes) [x]  97140 (Manual Therapy)   []  97130 (Cognitive Training, each add'l  15 minutes)  [x]  97164 (Re-evaluation)                              []  Other, List CPT Code ____________  [x]  97530 (02725peutic Activities)     [x]  97535 (36644Care)   []  All codes above (97110 - 97535)  []  97012 (Mechanical Traction)  [x]  97014 (E-stim Unattended)  [x]  97032 (E-stim manual)  [x]  97033 (Ionto)  [x]  97035 (Ultrasound) []  97750 (Physical Performance Training) [x]  97113 ([id number] Therapy) []  97016 (Vasopneumatic Device) []  97018 ([id number]) []  97034 (Contrast Bath) []  97597 (Wound Care 1st 20 sq cm) []  97598 (Wound Care each add'l  20 sq cm) []  97760 (Orthotic Fabrication, Fitting, Training Initial) []  [id number] (Prosthetic Management and Training Initial) []  [phone] (Orthotic or Prosthetic Training/ Modification Subsequent)

## 2022-03-14 ENCOUNTER — Ambulatory Visit: Payer: Medicare HMO

## 2022-03-14 DIAGNOSIS — R262 Difficulty in walking, not elsewhere classified: Secondary | ICD-10-CM | POA: Diagnosis not present

## 2022-03-14 DIAGNOSIS — M6281 Muscle weakness (generalized): Secondary | ICD-10-CM

## 2022-03-14 DIAGNOSIS — M25672 Stiffness of left ankle, not elsewhere classified: Secondary | ICD-10-CM

## 2022-03-14 DIAGNOSIS — M25671 Stiffness of right ankle, not elsewhere classified: Secondary | ICD-10-CM

## 2022-03-18 ENCOUNTER — Ambulatory Visit: Payer: Medicare HMO

## 2022-03-18 DIAGNOSIS — R262 Difficulty in walking, not elsewhere classified: Secondary | ICD-10-CM | POA: Diagnosis not present

## 2022-03-18 DIAGNOSIS — M25671 Stiffness of right ankle, not elsewhere classified: Secondary | ICD-10-CM

## 2022-03-18 DIAGNOSIS — M25672 Stiffness of left ankle, not elsewhere classified: Secondary | ICD-10-CM

## 2022-03-18 DIAGNOSIS — M6281 Muscle weakness (generalized): Secondary | ICD-10-CM

## 2022-03-18 NOTE — Therapy (Signed)
OUTPATIENT PHYSICAL THERAPY TREATMENT NOTE   Patient Name: Thomas Warner MRN: [id number] DOB:04/14/1961, 61 y.o., male Today's Date: 03/18/2022  PCP: Jolinda Croak, MD REFERRING PROVIDER: Jolinda Croak, MD  END OF SESSION:   PT End of Session - 03/18/22 0832     Visit Number 22    Number of Visits 52    Date for PT Re-Evaluation 05/03/22    Authorization Type [employer]IC[employer] HMO; MEDICAID OF [place]    Authorization Time Period 12 PT visits 01/29/2022-03/15/2022; 03/14/22- New Auth requested    Authorization - Visit Number 18    Authorization - Number of Visits 22    Progress Note Due on Visit 30    PT Start Time 0830    PT Stop Time 0910    PT Time Calculation (min) 40 min    Equipment Utilized During Treatment Other (comment)   RW   Activity Tolerance Patient tolerated treatment well    Behavior During Therapy WFL for tasks assessed/performed              Past Medical History:  Diagnosis Date   Arthritis    GERD (gastroesophageal reflux disease)    Hypertension    Past Surgical History:  Procedure Laterality Date   LUMBAR LAMINECTOMY/DECOMPRESSION MICRODISCECTOMY N/A 08/03/2021   Procedure: Thoracic Eleven-Twelve  Decompression;  Surgeon: Ashok Pall, MD;  Location: [facility];  Service: Neurosurgery;  Laterality: N/A;  3C/RM 20   NECK SURGERY     approx 2000, plate and screws   Patient Active Problem List   Diagnosis Date Noted   Thoracic spinal stenosis 08/03/2021    REFERRING DIAG: spinal stenosis   THERAPY DIAG:  Difficulty in walking, not elsewhere classified  Stiffness of left ankle, not elsewhere classified  Stiffness of right ankle, not elsewhere classified  Muscle weakness (generalized)  PERTINENT HISTORY: Dr. Christella Noa performed a thoracic laminectomy on the patient on 08/03/2021. Cervical fusion 20 yrs ago   PRECAUTIONS: spasticity, abnormal gait and LE weakness  SUBJECTIVE: No changes to report, awaiting AFO delivery and may receive  trial braces 03/20/22  PAIN:  Are you having pain? 0/10 low back, achy in nature, worst with activity and better with rest. My feet have a burning and tingling sensation.    DIAGNOSTIC FINDINGS:    On: 06/23/2021 IMPRESSION: Cervical spine:   1. 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 2. Status post ACDF C3-C5. 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 3. Multilevel facet and uncovertebral hypertrophy, worst at C6-C7, where it is severe bilaterally, and C5-C6, where it is moderate to severe bilaterally.   Thoracic spine:   1. T11-T12 severe spinal canal stenosis and severe bilateral neural foraminal narrowing. Increased T2 signal within the spinal cord at this level without apparent cord expansion, which may represent cord edema. 2. Moderate to severe bilateral neural foraminal narrowing at T9-T10 and T10-T11. 3. Moderate bilateral neural foraminal narrowing at T1-T2, T2-T3, and T3-T4.   Lumbar spine:   1. L2-L3 and L3-L4 moderate to severe thecal sac narrowing, secondary to severe disc height loss, disc osteophyte complexes, disc bulges, and epidural lipomatosis. L2-L3 moderate bilateral neural foraminal narrowing and L3-L4 moderate to severe bilateral neural foraminal narrowing. 2. L4-L5 moderate thecal sac narrowing, due to the aforementioned factors, severe left and moderate to severe right neural foraminal narrowing. 3. L5-S1 severe bilateral neural foraminal narrowing     PATIENT  SURVEYS:  FOTO 56%, 01/08/22= 49%   SCREENING FOR RED FLAGS: Bowel or bladder incontinence: No Spinal tumors: No Cauda equina syndrome: No Compression fracture: No   COGNITION:           Overall cognitive status: Within functional limits for tasks assessed                          SENSATION: Not tested Pt reports the sensation of walking on bubble  wrap on the bottom of his feet.  This is increased after the exercises.    MUSCLE LENGTH: Hamstrings: Right 35 deg; Left 38 deg Tight hip flexors, hip adductors, heel cords bilaterally   POSTURE:  Forward flexed trunk   PALPATION: NT   LE MMT:   MMT Right 12/14/2021 Left 12/14/2021 R 01/18/22 L 01/18/22 R 02/20/22 L 02/20/22  Hip flexion 3+ 3 3+ 3+ 4 3+  Hip extension _0 Hip abduction _1 Hip adduction _2 Hip internal rotation _3 Hip external rotation _4 Knee flexion 3 3 3+ 4-    Knee extension 4 4 3+ 3+    Ankle dorsiflexion _5 Ankle plantarflexion _6 Ankle inversion          Ankle eversion           (Blank rows = not tested) MMT Right 03/14/22 Left 03/14/22 R  L  R  L   Hip flexion 4 4+      Hip extension 3 4      Hip abduction 3 3      Hip adduction 4 4+      Hip internal rotation 4 4+      Hip external rotation 3 3+      Knee flexion 4+ 5      Knee extension 5 5      Ankle dorsiflexion        Ankle plantarflexion        Ankle inversion          Ankle eversion           FUNCTIONAL TESTS:  5 times sit to stand: 26.4 s use of hands; 01/18/22 19s; 03/12/22= 15.5 sec 2 minute walk test: 133f; 01/18/22 1155f 03/12/22=16056f;  GAIT: Distance walked: 106.   03/12/22= [id number] device utilized: WalkEnvironmental consultant wheeled Level of assistance: Complete Independence Comments: gait deficits include forward flexed trunk, scissor gait, drop foot, genu recurvatum in    stance phase  [facility] Adult PT Treatment:                                                DATE: 03/18/22 Therapeutic Exercise: NuStep 8 mins L4 UE/LE LAQs with adduction 2x15 Seated PF/DF on RB 3s hold of DF, 30x Seated ham curls over towel 3s hold at end range, 15x B Supine alt UE/LE flexion 15/15 LTR 3s hold, 15/15 Supine heel taps 30s x2 STS from Airex 10x no UE support  [facility] Adult PT Treatment:  DATE: 03/14/22 Therapeutic Exercise: NuStep 6 mins L5 LE Abdominal bracing UE 90 LE 90 x4 15sec Standing hip ext x FM bar 2x10 Standing hip abdt x FM bar 2x10 Side stepping at counter 57f x 8 min A hand assist Dead lift from 18" 3x1015# Saeted hamstrings curls 2x15 GTB MMT of the Legs  [facility] Adult PT Treatment:                                                DATE: 03/12/22 Therapeutic Exercise: NuStep 6 mins L6 LE Abdominal bracing UE 90 LE 90 x4 15sec Dead bug alt opp UE/LE 3x10 each Figure 4 stretch x2 20" Bridging 2x10 Side stepping at counter 2x6x82fGTB Dead lift from 18" Therapeutic Activity: 5xSTS 2MWT  Walking for distance 370'  [facility] Adult PT Treatment:                                                DATE: 02/27/22 Therapeutic Exercise: NuStep 8 mins L8 UE/LE Abdominal bracing UE 90 LE 90 x4 15sec Dead bug alt opp UE/LE 3x10 each Figure 4 stretch x2 20" Quad arm lifts x10 each ( LE too difficult) Bridging 2x10 LTR x10 Open book x10 STS 2x5 s use of hands Side stepping at counter 2x4x8f26fTB Manual Therapy: PT assisted hip add and IR stertches x5, 20"  PATIENT EDUCATION:  Education details: Eval findings, POC, HEP Person educated: Patient Education method: Explanation, Demonstration, Tactile cues, Verbal cues, and Handouts Education comprehension: verbalized understanding, returned demonstration, verbal cues required, tactile cues required, and needs further education     HOME EXERCISE PROGRAM: Access Code: [email or web address] URL: [email or web address] Date: 01/02/2022 Prepared by: AllGar Pontoxercises - Sit to Stand Without Arm Support  - 1 x daily - 7 x weekly - 3 sets - 10 reps - 3 hold - Heel Raises with Counter Support  - 1 x daily - 7 x weekly - 3 sets - 10 reps - 3 hold - Seated Heel Toe Raises  - 1 x daily - 7 x weekly - 3 sets - 10 reps - 2 hold - Hooklying Clamshell with Resistance  - 1 x daily - 7 x weekly - 3 sets - 10 reps - 3 hold -  Standing Gastroc Stretch at Counter  - 1 x daily - 7 x weekly - 1 sets - 3 reps - 30 hold - Seated Piriformis Stretch with Trunk Bend  - 1 x daily - 7 x weekly - 1 sets - 3-5 reps - 30 hold - Standing Hip Abduction with Counter Support  - 1 x daily - 7 x weekly - 3 sets - 10 reps - 3 hold   ASSESSMENT:   CLINICAL IMPRESSION: Patient awaiting trial fit of OTC AFOs expected to arrive 03/20/22.  Today's session focused on function tasks for strength and tone reduction.  Emphasized 3s hold at end range DF and knee flexion.  Incorporated rotational movements to decrease spasm in LEs.  Continued abdominal/core tasks with integrated LE movements  OBJECTIVE IMPAIRMENTS Abnormal gait, decreased activity tolerance, decreased balance, difficulty walking, decreased ROM, decreased strength, impaired flexibility, and postural dysfunction.    ACTIVITY LIMITATIONS cleaning, community activity, meal prep, yard work, shopping, and  hobbies .    PERSONAL FACTORS Past/current experiences, Time since onset of injury/illness/exacerbation, and 1-2 comorbidities:    arthritis and previous neck surgery around 2000 are also affecting patient's functional outcome.   REHAB POTENTIAL: Good   CLINICAL DECISION MAKING: Evolving/moderate complexity   EVALUATION COMPLEXITY: Moderate     GOALS:   SHORT TERM GOALS: Target date: 12/28/2021   Pt will be Ind in an initial HEP Baseline:started on eval Goal status: MET   LONG TERM GOALS: Target date: 03/15/22   Increase pt's 2 min walking test to 254f or more as indication of improved function Baseline: 106 c RW; 01/18/22: 2MWT distance 1179fwith RW and SBA. 03/12/22=18579foal status: Improving   2.  Decreased pt's 5xSTS time to 20" or less s use of hands Baseline: 26.4 sec without use of hands; 01/18/22: 19s using hands on lap 02/27/22: 18". 03/12/22=15.5 sec Goal status: MET-and continuing to improve   3.  Increase pt's hip strength to at least 3+/5 and knee strength to  at least 4+/5 for improved function of the LEs 01/18/22: See above flow  sheet    Baseline: See flow sheets Goal status: Improving   4.  Pt will demonstrate improve gait walking with a LRAD, improved gait pattern, and for an increased distance of 400f11fseline: 106 ft c RW; 01/18/22 200ft14fh TW and SBA. 03/12/22= 370ft 75f status: Improving   5.  Pt will be Ind in a final HEP to maintain achieved LOF Baseline: started on eval Goal status: Ongoing   PLAN: PT FREQUENCY: 2x/week   PT DURATION: 6 weeks   PLANNED INTERVENTIONS: Therapeutic exercises, Therapeutic activity, Neuromuscular re-education, Balance training, Gait training, Patient/Family education, Joint mobilization, Stair training, Aquatic Therapy, Dry Needling, Electrical stimulation, Cryotherapy, Moist heat, Taping, Ultrasound, Ionotophoresis 4mg/ml30mxamethasone, and Manual therapy.   PLAN FOR NEXT SESSION: Continue to address flexibility and strength. Continue assessing LTGs. Follow up on patient concerns over AFOs?  Jeff ZiLeroy Sea/17/23 9:14 AM

## 2022-03-20 ENCOUNTER — Ambulatory Visit: Payer: Medicare HMO

## 2022-03-20 DIAGNOSIS — R262 Difficulty in walking, not elsewhere classified: Secondary | ICD-10-CM | POA: Diagnosis not present

## 2022-03-20 DIAGNOSIS — M25672 Stiffness of left ankle, not elsewhere classified: Secondary | ICD-10-CM

## 2022-03-20 DIAGNOSIS — M25671 Stiffness of right ankle, not elsewhere classified: Secondary | ICD-10-CM

## 2022-03-20 DIAGNOSIS — M6281 Muscle weakness (generalized): Secondary | ICD-10-CM

## 2022-03-20 NOTE — Therapy (Signed)
OUTPATIENT PHYSICAL THERAPY TREATMENT NOTE   Patient Name: Thomas Warner MRN: [id number] DOB:1961/03/24, 61 y.o., male Today's Date: 03/20/2022  PCP: Jolinda Croak, MD REFERRING PROVIDER: Jolinda Croak, MD  END OF SESSION:   PT End of Session - 03/20/22 1016     Visit Number 23    Number of Visits 33    Date for PT Re-Evaluation 05/03/22    Authorization Type [employer] M[employer] Approved 12 visits 03/16/22-04/27/22    Authorization - Visit Number 2    Authorization - Number of Visits 12    Progress Note Due on Visit 53    PT Start Time 0935    PT Stop Time 1020    PT Time Calculation (min) 45 min    Equipment Utilized During Treatment Other (comment)   RW   Activity Tolerance Patient tolerated treatment well    Behavior During Therapy WFL for tasks assessed/performed               Past Medical History:  Diagnosis Date   Arthritis    GERD (gastroesophageal reflux disease)    Hypertension    Past Surgical History:  Procedure Laterality Date   LUMBAR LAMINECTOMY/DECOMPRESSION MICRODISCECTOMY N/A 08/03/2021   Procedure: Thoracic Eleven-Twelve  Decompression;  Surgeon: Ashok Pall, MD;  Location: [place];  Service: Neurosurgery;  Laterality: N/A;  3C/RM 20   NECK SURGERY     approx 2000, plate and screws   Patient Active Problem List   Diagnosis Date Noted   Thoracic spinal stenosis 08/03/2021    REFERRING DIAG: spinal stenosis   THERAPY DIAG:  Difficulty in walking, not elsewhere classified  Stiffness of left ankle, not elsewhere classified  Stiffness of right ankle, not elsewhere classified  Muscle weakness (generalized)  PERTINENT HISTORY: Dr. Christella Noa performed a thoracic laminectomy on the patient on 08/03/2021. Cervical fusion 20 yrs ago   PRECAUTIONS: spasticity, abnormal gait and LE weakness  SUBJECTIVE: Pt reports he is hoping to get with the rep re: the AFOs soon.  PAIN:  Are you having pain? 0/10 low back, achy in nature, worst with  activity and better with rest. My feet have a burning and tingling sensation.    DIAGNOSTIC FINDINGS:    On: 06/23/2021 IMPRESSION: Cervical spine:   1. 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 2. Status post ACDF C3-C5. 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 3. Multilevel facet and uncovertebral hypertrophy, worst at C6-C7, where it is severe bilaterally, and C5-C6, where it is moderate to severe bilaterally.   Thoracic spine:   1. T11-T12 severe spinal canal stenosis and severe bilateral neural foraminal narrowing. Increased T2 signal within the spinal cord at this level without apparent cord expansion, which may represent cord edema. 2. Moderate to severe bilateral neural foraminal narrowing at T9-T10 and T10-T11. 3. Moderate bilateral neural foraminal narrowing at T1-T2, T2-T3, and T3-T4.   Lumbar spine:   1. L2-L3 and L3-L4 moderate to severe thecal sac narrowing, secondary to severe disc height loss, disc osteophyte complexes, disc bulges, and epidural lipomatosis. L2-L3 moderate bilateral neural foraminal narrowing and L3-L4 moderate to severe bilateral neural foraminal narrowing. 2. L4-L5 moderate thecal sac narrowing, due to the aforementioned factors, severe left and moderate to severe right neural foraminal narrowing. 3. L5-S1 severe bilateral neural foraminal narrowing     PATIENT SURVEYS:  FOTO 56%, 01/08/22= 49%   SCREENING FOR RED FLAGS:  Bowel or bladder incontinence: No Spinal tumors: No Cauda equina syndrome: No Compression fracture: No   COGNITION:           Overall cognitive status: Within functional limits for tasks assessed                          SENSATION: Not tested Pt reports the sensation of walking on bubble wrap on the bottom of his feet.  This is increased after the exercises.    MUSCLE  LENGTH: Hamstrings: Right 35 deg; Left 38 deg Tight hip flexors, hip adductors, heel cords bilaterally   POSTURE:  Forward flexed trunk   PALPATION: NT   LE MMT:   MMT Right 12/14/2021 Left 12/14/2021 R 01/18/22 L 01/18/22 R 02/20/22 L 02/20/22  Hip flexion 3+ 3 3+ 3+ 4 3+  Hip extension 2 2 3 3     Hip abduction 2 2 3 3 3 3   Hip adduction 4 4 4 4 4 4   Hip internal rotation 4 4 4 4     Hip external rotation 2 2 3 3     Knee flexion 3 3 3+ 4-    Knee extension 4 4 3+ 3+    Ankle dorsiflexion 2 2 3 3     Ankle plantarflexion 3 3 3 3     Ankle inversion          Ankle eversion           (Blank rows = not tested) MMT Right 03/14/22 Left 03/14/22 R  L  R  L   Hip flexion 4 4+      Hip extension 3 4      Hip abduction 3 3      Hip adduction 4 4+      Hip internal rotation 4 4+      Hip external rotation 3 3+      Knee flexion 4+ 5      Knee extension 5 5      Ankle dorsiflexion        Ankle plantarflexion        Ankle inversion          Ankle eversion           FUNCTIONAL TESTS:  5 times sit to stand: 26.4 s use of hands; 01/18/22 19s; 03/12/22= 15.5 sec 2 minute walk test: 157f; 01/18/22 1119f 03/12/22=16046f;  GAIT: Distance walked: 106.   03/12/22= [id number] device utilized: WalkEnvironmental consultant wheeled Level of assistance: Complete Independence Comments: gait deficits include forward flexed trunk, scissor gait, drop foot, genu recurvatum in    stance phase  [facility] Adult PT Treatment:                                                DATE: 03/19/22 Therapeutic Exercise: NuStep 8 mins L8 LE/UE Lateral step ups 6" box 2x10 each Banded side steps GTB 8ft 78f Side stepping at counter 8ft x77fmin A hand assist Slant board stretch x2 30" Omega knee ext 15# 2x10 Omega knee ext 15# 2x10 Omega leg press 45# 3x10 Dead lift 20" 25# 2x10   [facility] Adult PT Treatment:  DATE: 03/18/22 Therapeutic Exercise: NuStep 8 mins L4 UE/LE LAQs with  adduction 2x15 Seated PF/DF on RB 3s hold of DF, 30x Seated ham curls over towel 3s hold at end range, 15x B Supine alt UE/LE flexion 15/15 LTR 3s hold, 15/15 Supine heel taps 30s x2 STS from Airex 10x no UE support  [facility] Adult PT Treatment:                                                DATE: 03/14/22 Therapeutic Exercise: NuStep 6 mins L5 LE Abdominal bracing UE 90 LE 90 x4 15sec Standing hip ext x FM bar 2x10 Standing hip abdt x FM bar 2x10 Side stepping at counter 88f x 8 min A hand assist Dead lift from 18" 3x1015# Saeted hamstrings curls 2x15 GTB MMT of the Legs  PATIENT EDUCATION:  Education details: Eval findings, POC, HEP Person educated: Patient Education method: Explanation, Demonstration, Tactile cues, Verbal cues, and Handouts Education comprehension: verbalized understanding, returned demonstration, verbal cues required, tactile cues required, and needs further education     HOME EXERCISE PROGRAM: Access Code: [email or web address] URL: [email or web address] Date: 01/02/2022 Prepared by: AGar Ponto Exercises - Sit to Stand Without Arm Support  - 1 x daily - 7 x weekly - 3 sets - 10 reps - 3 hold - Heel Raises with Counter Support  - 1 x daily - 7 x weekly - 3 sets - 10 reps - 3 hold - Seated Heel Toe Raises  - 1 x daily - 7 x weekly - 3 sets - 10 reps - 2 hold - Hooklying Clamshell with Resistance  - 1 x daily - 7 x weekly - 3 sets - 10 reps - 3 hold - Standing Gastroc Stretch at Counter  - 1 x daily - 7 x weekly - 1 sets - 3 reps - 30 hold - Seated Piriformis Stretch with Trunk Bend  - 1 x daily - 7 x weekly - 1 sets - 3-5 reps - 30 hold - Standing Hip Abduction with Counter Support  - 1 x daily - 7 x weekly - 3 sets - 10 reps - 3 hold   ASSESSMENT:   CLINICAL IMPRESSION: Appt c rep for AFOs is stll to be scheduled. PT today focused on LE and trunk strengthening to improve functional mobility. Pt participates in PT with excellent effort. Pt tolerated  without adverse effects. Complete gait training with AFOs as pt receives.  OBJECTIVE IMPAIRMENTS Abnormal gait, decreased activity tolerance, decreased balance, difficulty walking, decreased ROM, decreased strength, impaired flexibility, and postural dysfunction.    ACTIVITY LIMITATIONS cleaning, community activity, meal prep, yard work, shopping, and hobbies .    PERSONAL FACTORS Past/current experiences, Time since onset of injury/illness/exacerbation, and 1-2 comorbidities:    arthritis and previous neck surgery around 2000 are also affecting patient's functional outcome.   REHAB POTENTIAL: Good   CLINICAL DECISION MAKING: Evolving/moderate complexity   EVALUATION COMPLEXITY: Moderate     GOALS:   SHORT TERM GOALS: Target date: 12/28/2021   Pt will be Ind in an initial HEP Baseline:started on eval Goal status: MET   LONG TERM GOALS: Target date: 03/15/22   Increase pt's 2 min walking test to 2531for more as indication of improved function Baseline: 106 c RW; 01/18/22: 2MWT distance 11216fith RW and SBA. 03/12/22=185f73fal status: Improving   2.  Decreased pt's 5xSTS time to 20" or less s use of hands Baseline: 26.4 sec without use of hands; 01/18/22: 19s using hands on lap 02/27/22: 18". 03/12/22=15.5 sec Goal status: MET-and continuing to improve   3.  Increase pt's hip strength to at least 3+/5 and knee strength to at least 4+/5 for improved function of the LEs 01/18/22: See above flow  sheet    Baseline: See flow sheets Goal status: Improving   4.  Pt will demonstrate improve gait walking with a LRAD, improved gait pattern, and for an increased distance of 491f Baseline: 106 ft c RW; 01/18/22 2060fwith TW and SBA. 03/12/22= [id number] status: Improving   5.  Pt will be Ind in a final HEP to maintain achieved LOF Baseline: started on eval Goal status: Ongoing   PLAN: PT FREQUENCY: 2x/week   PT DURATION: 6 weeks   PLANNED INTERVENTIONS: Therapeutic exercises,  Therapeutic activity, Neuromuscular re-education, Balance training, Gait training, Patient/Family education, Joint mobilization, Stair training, Aquatic Therapy, Dry Needling, Electrical stimulation, Cryotherapy, Moist heat, Taping, Ultrasound, Ionotophoresis 4mg58m Dexamethasone, and Manual therapy.   PLAN FOR NEXT SESSION: Continue to address flexibility and strength. Continue assessing LTGs. Follow up on patient concerns over AFOs?  Atara Paterson MS, PT 03/20/22 1:14 PM

## 2022-03-26 NOTE — Therapy (Addendum)
OUTPATIENT PHYSICAL THERAPY TREATMENT NOTE/DIscharge   Patient Name: Thomas Warner MRN: [id number] DOB:[birth year], 61 y.o., male Today's Date: 03/27/2022  PCP: Stevphen Rochester, MD REFERRING PROVIDER: Stevphen Rochester, MD  END OF SESSION:   PT End of Session - 03/27/22 0856     Visit Number 24    Number of Visits 33    Date for PT Re-Evaluation 05/03/22    Authorization Type [employer] MCR Approved 12 visits 03/16/22-04/27/22    Authorization Time Period 12 PT visits 01/29/2022-03/15/2022; 03/14/22- New Auth requested    Authorization - Visit Number 3    Authorization - Number of Visits 12    Progress Note Due on Visit 30    PT Start Time 0848    PT Stop Time 0930    PT Time Calculation (min) 42 min    Equipment Utilized During Treatment Other (comment)    Activity Tolerance Patient tolerated treatment well    Behavior During Therapy WFL for tasks assessed/performed                Past Medical History:  Diagnosis Date   Arthritis    GERD (gastroesophageal reflux disease)    Hypertension    Past Surgical History:  Procedure Laterality Date   LUMBAR LAMINECTOMY/DECOMPRESSION MICRODISCECTOMY N/A 08/03/2021   Procedure: Thoracic Eleven-Twelve  Decompression;  Surgeon: Coletta Memos, MD;  Location: [facility] OR;  Service: Neurosurgery;  Laterality: N/A;  3C/RM 20   NECK SURGERY     approx 2000, plate and screws   Patient Active Problem List   Diagnosis Date Noted   Thoracic spinal stenosis 08/03/2021    REFERRING DIAG: spinal stenosis   THERAPY DIAG:  Difficulty in walking, not elsewhere classified  Stiffness of left ankle, not elsewhere classified  Stiffness of right ankle, not elsewhere classified  Muscle weakness (generalized)  PERTINENT HISTORY: Dr. Franky Macho performed a thoracic laminectomy on the patient on 08/03/2021. Cervical fusion 20 yrs ago   PRECAUTIONS: spasticity, abnormal gait and LE weakness  SUBJECTIVE: Pt reports the new Lorrin Mais are not  helping him to walk better. Pt notes he is now walking with his legs scissoring more. Pt requested that they be removed. Pt notes he is now walking with his legs scissoring more.  PAIN:  Are you having pain? 6/10 low back, more pain than usual. Usually achy in nature, worst with activity and better with rest. My feet have a burning and tingling sensation.    DIAGNOSTIC FINDINGS:    On: 06/23/2021 IMPRESSION: Cervical spine:   1. 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 2. Status post ACDF C3-C5. 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 3. Multilevel facet and uncovertebral hypertrophy, worst at C6-C7, where it is severe bilaterally, and C5-C6, where it is moderate to severe bilaterally.   Thoracic spine:   1. T11-T12 severe spinal canal stenosis and severe bilateral neural foraminal narrowing. Increased T2 signal within the spinal cord at this level without apparent cord expansion, which may represent cord edema. 2. Moderate to severe bilateral neural foraminal narrowing at T9-T10 and T10-T11. 3. Moderate bilateral neural foraminal narrowing at T1-T2, T2-T3, and T3-T4.   Lumbar spine:   1. L2-L3 and L3-L4 moderate to severe thecal sac narrowing, secondary to severe disc height loss, disc osteophyte complexes, disc bulges, and epidural lipomatosis. L2-L3 moderate bilateral neural foraminal narrowing and L3-L4 moderate to severe bilateral neural foraminal narrowing.  2. L4-L5 moderate thecal sac narrowing, due to the aforementioned factors, severe left and moderate to severe right neural foraminal narrowing. 3. L5-S1 severe bilateral neural foraminal narrowing     PATIENT SURVEYS:  FOTO 56%, 01/08/22= 49%   SCREENING FOR RED FLAGS: Bowel or bladder incontinence: No Spinal tumors: No Cauda equina syndrome: No Compression fracture: No    COGNITION:           Overall cognitive status: Within functional limits for tasks assessed                          SENSATION: Not tested Pt reports the sensation of walking on bubble wrap on the bottom of his feet.  This is increased after the exercises.    MUSCLE LENGTH: Hamstrings: Right 35 deg; Left 38 deg Tight hip flexors, hip adductors, heel cords bilaterally   POSTURE:  Forward flexed trunk   PALPATION: NT   LE MMT:   MMT Right 12/14/2021 Left 12/14/2021 R 01/18/22 L 01/18/22 R 02/20/22 L 02/20/22  Hip flexion 3+ 3 3+ 3+ 4 3+  Hip extension 2 2 3 3     Hip abduction 2 2 3 3 3 3   Hip adduction 4 4 4 4 4 4   Hip internal rotation 4 4 4 4     Hip external rotation 2 2 3 3     Knee flexion 3 3 3+ 4-    Knee extension 4 4 3+ 3+    Ankle dorsiflexion 2 2 3 3     Ankle plantarflexion 3 3 3 3     Ankle inversion          Ankle eversion           (Blank rows = not tested) MMT Right 03/14/22 Left 03/14/22 R  L  R  L   Hip flexion 4 4+      Hip extension 3 4      Hip abduction 3 3      Hip adduction 4 4+      Hip internal rotation 4 4+      Hip external rotation 3 3+      Knee flexion 4+ 5      Knee extension 5 5      Ankle dorsiflexion        Ankle plantarflexion        Ankle inversion          Ankle eversion           FUNCTIONAL TESTS:  5 times sit to stand: 26.4 s use of hands; 01/18/22 19s; 03/12/22= 15.5 sec 2 minute walk test: 172ft; 01/18/22 140ft; 03/12/22=183ft  ;  GAIT: Distance walked: 106.   03/12/22= 336ft Assistive device utilized: Environmental consultant - 4 wheeled Level of assistance: Complete Independence Comments: gait deficits include forward flexed trunk, scissor gait, drop foot, genu recurvatum in    stance phase  [facility] Adult PT Treatment:                                                DATE: 03/27/22 Therapeutic Exercise: Side stepping at counter 63ft x 8 min A hand assist Omega knee ext 15# 2x10 Omega knee ext 15# 2x10 Omega leg press 45# 3x10 Dead lift 20"  25# 2x10 Gait training: With Xtern AFOs on, pt walked with increased scissoring  of LE, bilat ankle DF was not improved. Without the AFOs, scissoring of B legs was improved  [facility] Adult PT Treatment:                                                DATE: 03/19/22 Therapeutic Exercise: NuStep 8 mins L8 LE/UE Lateral step ups 6" box 2x10 each Banded side steps GTB 11ft x 8 Side stepping at counter 5ft x 8 min A hand assist Slant board stretch x2 30" Omega knee ext 15# 2x10 Omega knee ext 15# 2x10 Omega leg press 45# 3x10 Dead lift 20" 25# 2x10   [facility] Adult PT Treatment:                                                DATE: 03/18/22 Therapeutic Exercise: NuStep 8 mins L4 UE/LE LAQs with adduction 2x15 Seated PF/DF on RB 3s hold of DF, 30x Seated ham curls over towel 3s hold at end range, 15x B Supine alt UE/LE flexion 15/15 LTR 3s hold, 15/15 Supine heel taps 30s x2 STS from Airex 10x no UE support   PATIENT EDUCATION:  Education details: Eval findings, POC, HEP Person educated: Patient Education method: Explanation, Demonstration, Tactile cues, Verbal cues, and Handouts Education comprehension: verbalized understanding, returned demonstration, verbal cues required, tactile cues required, and needs further education     HOME EXERCISE PROGRAM: Access Code: [email or web address] URL: [email or web address] Date: 01/02/2022 Prepared by: Joellyn Rued  Exercises - Sit to Stand Without Arm Support  - 1 x daily - 7 x weekly - 3 sets - 10 reps - 3 hold - Heel Raises with Counter Support  - 1 x daily - 7 x weekly - 3 sets - 10 reps - 3 hold - Seated Heel Toe Raises  - 1 x daily - 7 x weekly - 3 sets - 10 reps - 2 hold - Hooklying Clamshell with Resistance  - 1 x daily - 7 x weekly - 3 sets - 10 reps - 3 hold - Standing Gastroc Stretch at Counter  - 1 x daily - 7 x weekly - 1 sets - 3 reps - 30 hold - Seated Piriformis Stretch with Trunk Bend  - 1 x daily - 7 x weekly - 1 sets - 3-5 reps -  30 hold - Standing Hip Abduction with Counter Support  - 1 x daily - 7 x weekly - 3 sets - 10 reps - 3 hold   ASSESSMENT:   CLINICAL IMPRESSION: Xtern AFOs were applied to the pt's shoes with arrival to PT. With gait training the Xtern AFOs were not helpful in improving his ankle DF and the scissoring of both legs was increased. The scissoring of the legs was improved when the Xtern AFOs were removed. PT was then completed for bilat LE strengthening. The demand of therex was increased with the pt tolerating without adverse effects. Pt's neurological tone may be impacting the effectiveness of the Xtern AFOs. Pt may benefit from a more traditional AFO to improve the quality of his gait pattern. Will initiate obtaining a referral to an orthotic clinic for an assessment of traditional AFOs.  OBJECTIVE IMPAIRMENTS Abnormal gait, decreased activity tolerance, decreased balance, difficulty walking, decreased ROM, decreased strength,  impaired flexibility, and postural dysfunction.    ACTIVITY LIMITATIONS cleaning, community activity, meal prep, yard work, shopping, and hobbies .    PERSONAL FACTORS Past/current experiences, Time since onset of injury/illness/exacerbation, and 1-2 comorbidities:    arthritis and previous neck surgery around 2000 are also affecting patient's functional outcome.   REHAB POTENTIAL: Good   CLINICAL DECISION MAKING: Evolving/moderate complexity   EVALUATION COMPLEXITY: Moderate     GOALS:   SHORT TERM GOALS: Target date: 12/28/2021   Pt will be Ind in an initial HEP Baseline:started on eval Goal status: MET   LONG TERM GOALS: Target date: 03/15/22   Increase pt's 2 min walking test to 237ft or more as indication of improved function Baseline: 106 c RW; 01/18/22: distance 179ft with RW and SBA. 03/12/22=175ft Goal status: Improving   2.  Decreased pt's 5xSTS time to 20" or less s use of hands Baseline: 26.4 sec without use of hands; 01/18/22: 19s using hands on  lap 02/27/22: 18". 03/12/22=15.5 sec Goal status: MET-and continuing to improve   3.  Increase pt's hip strength to at least 3+/5 and knee strength to at least 4+/5 for improved function of the LEs 01/18/22: See above flow  sheet    Baseline: See flow sheets Goal status: Improving   4.  Pt will demonstrate improve gait walking with a LRAD, improved gait pattern, and for an increased distance of 428ft Baseline: 106 ft c RW; 01/18/22 256ft with TW and SBA. 03/12/22= 33ft Goal status: Improving   5.  Pt will be Ind in a final HEP to maintain achieved LOF Baseline: started on eval Goal status: Ongoing   PLAN: PT FREQUENCY: 2x/week   PT DURATION: 6 weeks   PLANNED INTERVENTIONS: Therapeutic exercises, Therapeutic activity, Neuromuscular re-education, Balance training, Gait training, Patient/Family education, Joint mobilization, Stair training, Aquatic Therapy, Dry Needling, Electrical stimulation, Cryotherapy, Moist heat, Taping, Ultrasound, Ionotophoresis 4mg /ml Dexamethasone, and Manual therapy.   PLAN FOR NEXT SESSION: Continue to address flexibility and strength. Continue assessing LTGs.   Coleby Yett MS, PT 03/27/22 1:33 PM  PHYSICAL THERAPY DISCHARGE SUMMARY  Visits from Start of Care: 24  Current functional level related to goals / functional outcomes: Unknown- pt stopped attending   Remaining deficits: Unknown- pt stopped attending   Education / Equipment: HEP   Patient agrees to discharge. Patient goals were partially met. Patient is being discharged due to not returning since the last visit.    Gerardo Territo MS, PT 03/20/23 8:35 AM

## 2022-03-27 ENCOUNTER — Ambulatory Visit: Payer: Medicare HMO

## 2022-03-27 DIAGNOSIS — M6281 Muscle weakness (generalized): Secondary | ICD-10-CM

## 2022-03-27 DIAGNOSIS — R262 Difficulty in walking, not elsewhere classified: Secondary | ICD-10-CM | POA: Diagnosis not present

## 2022-03-27 DIAGNOSIS — M25671 Stiffness of right ankle, not elsewhere classified: Secondary | ICD-10-CM

## 2022-03-27 DIAGNOSIS — M25672 Stiffness of left ankle, not elsewhere classified: Secondary | ICD-10-CM

## 2022-03-29 ENCOUNTER — Ambulatory Visit: Payer: Medicare HMO

## 2022-04-03 ENCOUNTER — Ambulatory Visit: Payer: Medicare HMO

## 2022-04-05 ENCOUNTER — Ambulatory Visit: Payer: Medicare HMO

## 2024-06-25 ENCOUNTER — Other Ambulatory Visit: Payer: Self-pay

## 2024-06-25 ENCOUNTER — Encounter (HOSPITAL_COMMUNITY): Payer: Self-pay | Admitting: Emergency Medicine

## 2024-06-25 ENCOUNTER — Emergency Department (HOSPITAL_COMMUNITY)

## 2024-06-25 ENCOUNTER — Emergency Department (HOSPITAL_COMMUNITY): Admission: EM | Admit: 2024-06-25 | Discharge: 2024-06-25 | Disposition: A | Discharge status: Home / Self Care

## 2024-06-25 DIAGNOSIS — I959 Hypotension, unspecified: Secondary | ICD-10-CM | POA: Diagnosis not present

## 2024-06-25 DIAGNOSIS — R55 Syncope and collapse: Secondary | ICD-10-CM

## 2024-06-25 DIAGNOSIS — E860 Dehydration: Secondary | ICD-10-CM | POA: Diagnosis not present

## 2024-06-25 LAB — CBC WITH DIFFERENTIAL/PLATELET
Abs Immature Granulocytes: 0.01 K/uL (ref 0.00–0.07)
Basophils Absolute: 0 K/uL (ref 0.0–0.1)
Basophils Relative: 1 %
Eosinophils Absolute: 0.9 K/uL — ABNORMAL HIGH (ref 0.0–0.5)
Eosinophils Relative: 12 %
HCT: 31.3 % — ABNORMAL LOW (ref 39.0–52.0)
Hemoglobin: 11.6 g/dL — ABNORMAL LOW (ref 13.0–17.0)
Immature Granulocytes: 0 %
Lymphocytes Relative: 26 %
Lymphs Abs: 1.9 K/uL (ref 0.7–4.0)
MCH: 33.2 pg (ref 26.0–34.0)
MCHC: 37.1 g/dL — ABNORMAL HIGH (ref 30.0–36.0)
MCV: 89.7 fL (ref 80.0–100.0)
Monocytes Absolute: 0.4 K/uL (ref 0.1–1.0)
Monocytes Relative: 5 %
Neutro Abs: 4.1 K/uL (ref 1.7–7.7)
Neutrophils Relative %: 56 %
Platelets: 225 K/uL (ref 150–400)
RBC: 3.49 MIL/uL — ABNORMAL LOW (ref 4.22–5.81)
RDW: 14.2 % (ref 11.5–15.5)
WBC: 7.2 K/uL (ref 4.0–10.5)
nRBC: 0 % (ref 0.0–0.2)

## 2024-06-25 LAB — TROPONIN T, HIGH SENSITIVITY
Troponin T High Sensitivity: 20 ng/L — ABNORMAL HIGH (ref 0–19)
Troponin T High Sensitivity: 19 ng/L (ref 0–19)

## 2024-06-25 LAB — HEPATIC FUNCTION PANEL
ALT: 13 U/L (ref 0–44)
AST: 21 U/L (ref 15–41)
Albumin: 3.7 g/dL (ref 3.5–5.0)
Alkaline Phosphatase: 71 U/L (ref 38–126)
Bilirubin, Direct: 0.2 mg/dL (ref 0.0–0.2)
Indirect Bilirubin: 0.3 mg/dL (ref 0.3–0.9)
Total Bilirubin: 0.5 mg/dL (ref 0.0–1.2)
Total Protein: 6.4 g/dL — ABNORMAL LOW (ref 6.5–8.1)

## 2024-06-25 LAB — BASIC METABOLIC PANEL WITH GFR
Anion gap: 12 (ref 5–15)
BUN: 18 mg/dL (ref 8–23)
CO2: 19 mmol/L — ABNORMAL LOW (ref 22–32)
Calcium: 9.5 mg/dL (ref 8.9–10.3)
Chloride: 104 mmol/L (ref 98–111)
Creatinine, Ser: 1.62 mg/dL — ABNORMAL HIGH (ref 0.61–1.24)
GFR, Estimated: 48 mL/min — ABNORMAL LOW (ref >60)
Glucose, Bld: 72 mg/dL (ref 70–99)
Potassium: 3.8 mmol/L (ref 3.5–5.1)
Sodium: 136 mmol/L (ref 135–145)

## 2024-06-25 LAB — LIPASE, BLOOD: Lipase: 18 U/L (ref 11–51)

## 2024-06-25 LAB — CBG MONITORING, ED: Glucose-Capillary: 76 mg/dL (ref 70–99)

## 2024-06-25 MED ORDER — LACTATED RINGERS IV BOLUS
1000.0000 mL | Freq: Once | INTRAVENOUS | Status: AC
  Administered 2024-06-25: 1000 mL via INTRAVENOUS

## 2024-06-25 NOTE — Discharge Instructions (Signed)
 Drink lots of fluids.  Stay on your medication as prescribed.  Follow-up with your primary care doctor in 1 to 2 weeks

## 2024-06-25 NOTE — ED Provider Notes (Signed)
 [place] EMERGENCY DEPARTMENT AT [facility] Provider Note   CSN: [id number] Arrival date & time: 06/25/24  1729     Patient presents with: Loss of Consciousness and Hypotension   Thomas Warner is a 63 y.o. male.   63 year old male presents for evaluation of syncope.  He states my body just that I fainted.  On EMS arrival blood pressure was low as he was given a 500 cc bolus of IV fluids.  Blood pressure still in the 80s.  Patient states he otherwise feels fine.  States he was sitting outside all day and has had 2 beers today.  Denies any other symptoms or concerns.   Loss of Consciousness Associated symptoms: no chest pain, no fever, no palpitations, no seizures, no shortness of breath and no vomiting        Prior to Admission medications   Medication Sig Start Date End Date Taking? Authorizing Provider  amLODipine  (NORVASC ) 5 MG tablet Take 5 mg by mouth daily.    [provider]  atorvastatin  (LIPITOR) 20 MG tablet Take 20 mg by mouth daily.    [provider]  HYDROcodone -acetaminophen  (NORCO) 7.5-325 MG tablet Take 1 tablet by mouth every 4 (four) hours as needed for moderate pain. 08/04/21   Mavis Purchase, MD  losartan -hydrochlorothiazide  (HYZAAR) 100-25 MG tablet Take 1 tablet by mouth daily.    [provider]  Multiple Vitamins-Minerals (MULTIVITAMIN WITH MINERALS) tablet Take 1 tablet by mouth 3 (three) times a week.    [provider]    Allergies: Patient has no known allergies.    Review of Systems  Constitutional:  Negative for chills and fever.  HENT:  Negative for ear pain and sore throat.   Eyes:  Negative for pain and visual disturbance.  Respiratory:  Negative for cough and shortness of breath.   Cardiovascular:  Positive for syncope. Negative for chest pain and palpitations.  Gastrointestinal:  Negative for abdominal pain and vomiting.  Genitourinary:  Negative for dysuria and hematuria.   Musculoskeletal:  Negative for arthralgias and back pain.  Skin:  Negative for color change and rash.  Neurological:  Positive for syncope. Negative for seizures.  All other systems reviewed and are negative.   Updated Vital Signs BP 115/83   Pulse (!) 57   Temp (!) 97.4 F (36.3 C) (Oral)   Resp 11   Ht 5' 9 (1.753 m)   Wt 79.4 kg   SpO2 100%   BMI 25.84 kg/m   Physical Exam Vitals and nursing note reviewed.  Constitutional:      General: He is not in acute distress.    Appearance: Normal appearance. He is well-developed. He is not ill-appearing.  HENT:     Head: Normocephalic and atraumatic.  Eyes:     Conjunctiva/sclera: Conjunctivae normal.  Cardiovascular:     Rate and Rhythm: Normal rate and regular rhythm.     Pulses: Normal pulses.     Heart sounds: Normal heart sounds. No murmur heard. Pulmonary:     Effort: Pulmonary effort is normal. No respiratory distress.     Breath sounds: Normal breath sounds.  Abdominal:     Palpations: Abdomen is soft.     Tenderness: There is no abdominal tenderness.  Musculoskeletal:        General: No swelling.     Cervical back: Neck supple.  Skin:    General: Skin is warm and dry.     Capillary Refill: Capillary refill takes less than  2 seconds.  Neurological:     Mental Status: He is alert.  Psychiatric:        Mood and Affect: Mood normal.     (all labs ordered are listed, but only abnormal results are displayed) Labs Reviewed  BASIC METABOLIC PANEL WITH GFR - Abnormal; Notable for the following components:      Result Value   CO2 19 (*)    Creatinine, Ser 1.62 (*)    GFR, Estimated 48 (*)    All other components within normal limits  HEPATIC FUNCTION PANEL - Abnormal; Notable for the following components:   Total Protein 6.4 (*)    All other components within normal limits  CBC WITH DIFFERENTIAL/PLATELET - Abnormal; Notable for the following components:   RBC 3.49 (*)    Hemoglobin 11.6 (*)    HCT 31.3 (*)     MCHC 37.1 (*)    Eosinophils Absolute 0.9 (*)    All other components within normal limits  TROPONIN T, HIGH SENSITIVITY - Abnormal; Notable for the following components:   Troponin T High Sensitivity 20 (*)    All other components within normal limits  LIPASE, BLOOD  URINALYSIS, ROUTINE W REFLEX MICROSCOPIC  CBG MONITORING, ED  TROPONIN T, HIGH SENSITIVITY    EKG: EKG Interpretation Date/Time:  Friday June 25 2024 19:51:45 EDT Ventricular Rate:  51 PR Interval:  303 QRS Duration:  107 QT Interval:  414 QTC Calculation: 382 R Axis:   73  Text Interpretation: Sinus rhythm Prolonged PR interval Abnormal R-wave progression, early transition Baseline artifact Compared with prior EKG Confirmed by Gennaro Bouchard ([id number]) on 06/25/2024 7:56:13 PM  Radiology: CT Head Wo Contrast Result Date: 06/25/2024 CLINICAL DATA:  Syncope. EXAM: CT HEAD WITHOUT CONTRAST TECHNIQUE: Contiguous axial images were obtained from the base of the skull through the vertex without intravenous contrast. RADIATION DOSE REDUCTION: This exam was performed according to the departmental dose-optimization program which includes automated exposure control, adjustment of the mA and/or kV according to patient size and/or use of iterative reconstruction technique. COMPARISON:  Jan 26, 2021 FINDINGS: Brain: No evidence of acute infarction, hemorrhage, hydrocephalus, extra-axial collection or mass lesion/mass effect. Vascular: No hyperdense vessel or unexpected calcification. Skull: Normal. Negative for fracture or focal lesion. Sinuses/Orbits: No acute finding. Other: None. IMPRESSION: No acute intracranial pathology. Electronically Signed   By: Suzen Dials M.D.   On: 06/25/2024 19:05   DG Chest 1 View Result Date: 06/25/2024 CLINICAL DATA:  chest pain, syncope EXAM: CHEST  1 VIEW COMPARISON:  None available. FINDINGS: No focal airspace consolidation, pleural effusion, or pneumothorax. No cardiomegaly. Tortuous  aorta. No acute fracture or destructive lesions. Multilevel thoracic osteophytosis. IMPRESSION: No acute cardiopulmonary abnormality. Electronically Signed   By: Rogelia Myers M.D.   On: 06/25/2024 18:57     Procedures   Medications Ordered in the ED  lactated ringers  bolus 1,000 mL (0 mLs Intravenous Stopped 06/25/24 2116)                                    Medical Decision Making Cardiac monitor interpretation: Sinus rhythm, no ectopy  Social determinants of health: History of alcohol abuse  Patient here for syncope with hypotension.  Think he is likely dehydrated.  Had a liter and half of IV fluids and feeling much better, able to ambulate without difficulty, orthostatics are negative and blood pressures improved.  Lab workup is unremarkable.  Discussed patient's results with him and his family at bedside and they all feel comfortable with plan for her to be discharged home.  He is advised to increase his fluid intake, stay on his medications as prescribed and follow-up with his primary care doctor.  Advised return for any new or worsening symptoms  Problems Addressed: Dehydration: acute illness or injury Hypotension, unspecified hypotension type: acute illness or injury Syncope, unspecified syncope type: acute illness or injury  Amount and/or Complexity of Data Reviewed External Data Reviewed: notes.    Details: Prior ED records reviewed patient follows up outpatient for hypertension Labs: ordered. Decision-making details documented in ED Course.    Details: Ordered and reviewed by me and unremarkable Radiology: ordered and independent interpretation performed. Decision-making details documented in ED Course.    Details: And interpreted by me independent the radiology Chest x-ray: Shows no acute abnormality CT head: Shows no acute intracranial process ECG/medicine tests: ordered and independent interpretation performed. Decision-making details documented in ED Course.     Details: Ordered and interpreted by me in the absence of cardiology and shows sinus rhythm, no STEMI or significant change when compared to prior EKG  Risk OTC drugs. Prescription drug management. Drug therapy requiring intensive monitoring for toxicity.     Final diagnoses:  Syncope, unspecified syncope type  Dehydration  Hypotension, unspecified hypotension type    ED Discharge Orders     None          Gennaro Duwaine CROME, DO 06/25/24 2328

## 2024-06-25 NOTE — ED Triage Notes (Signed)
 Pt to ER via EMS after witnessed syncopal episode in his front yard.  Pt states he has been outside since 11AM today.  Reports ETOH use today.  Upon EMS arrival BP 60/40, 500 cc NS bolus completed repeat 85/xx.  Second bolus is infusing at this time. Pt denies any pain, shortness of breath or dizziness at this time.
